# Patient Record
Sex: Male | Born: 1995
Health system: Southern US, Community
[De-identification: ages and names within clinical notes are randomized; demographics above are authoritative.]

## PROBLEM LIST (undated history)

## (undated) DIAGNOSIS — I10 Essential (primary) hypertension: Secondary | ICD-10-CM

## (undated) DIAGNOSIS — I639 Cerebral infarction, unspecified: Secondary | ICD-10-CM

## (undated) DIAGNOSIS — F419 Anxiety disorder, unspecified: Secondary | ICD-10-CM

## (undated) HISTORY — DX: Cerebral infarction, unspecified: I63.9

## (undated) HISTORY — DX: Anxiety disorder, unspecified: F41.9

## (undated) HISTORY — DX: Essential (primary) hypertension: I10

---
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
## 2016-02-29 ENCOUNTER — Other Ambulatory Visit: Payer: Self-pay | Admitting: Family Medicine

## 2016-05-12 ENCOUNTER — Other Ambulatory Visit: Payer: Self-pay | Admitting: Family Medicine

## 2016-06-22 ENCOUNTER — Other Ambulatory Visit: Payer: Self-pay | Admitting: Family Medicine

## 2016-08-01 ENCOUNTER — Other Ambulatory Visit: Payer: Self-pay | Admitting: Family Medicine

## 2016-08-02 ENCOUNTER — Other Ambulatory Visit: Payer: Self-pay | Admitting: Family Medicine

## 2016-08-29 ENCOUNTER — Other Ambulatory Visit: Payer: Self-pay | Admitting: Family Medicine

## 2016-09-18 ENCOUNTER — Other Ambulatory Visit: Payer: Self-pay | Admitting: Family Medicine

## 2016-09-18 NOTE — Telephone Encounter (Signed)
Patient needs refill on his amLODipine-benazepril (LOTREL) 5-10 MG capsule   and also his PARoxetine (PAXIL) 30 MG tablet  Patient has a appt with you on 09/24/16, patient also has only 2 pills left, [facility] in few [facility]

## 2016-09-19 MED ORDER — PAROXETINE HCL 30 MG PO TABS: 30.0000 mg | ORAL_TABLET | Freq: Every day | ORAL | 2 refills | Status: DC

## 2016-09-19 MED ORDER — AMLODIPINE BESY-BENAZEPRIL HCL 5-10 MG PO CAPS: 1.0000 | ORAL_CAPSULE | Freq: Every day | ORAL | 1 refills | Status: DC

## 2016-09-24 ENCOUNTER — Encounter: Payer: Self-pay | Admitting: Family Medicine

## 2016-09-24 ENCOUNTER — Ambulatory Visit (INDEPENDENT_AMBULATORY_CARE_PROVIDER_SITE_OTHER): Payer: BLUE CROSS/BLUE SHIELD | Admitting: Family Medicine

## 2016-09-24 VITALS — BP 126/82 | HR 80 | Temp 98.6°F | Resp 16 | Wt 155.0 lb

## 2016-09-24 DIAGNOSIS — F410 Panic disorder [episodic paroxysmal anxiety] without agoraphobia: Secondary | ICD-10-CM

## 2016-09-24 DIAGNOSIS — I10 Essential (primary) hypertension: Secondary | ICD-10-CM

## 2016-09-24 DIAGNOSIS — F321 Major depressive disorder, single episode, moderate: Secondary | ICD-10-CM

## 2016-09-24 MED ORDER — PAROXETINE HCL 30 MG PO TABS: 60.0000 mg | ORAL_TABLET | Freq: Every day | ORAL | 2 refills | Status: DC

## 2016-09-24 NOTE — Progress Notes (Signed)
Patient: Eddie Evans Male    DOB: [birth year]   21 y.o.   MRN: [id number] Visit Date: 09/24/2016  Today's Provider: Mila Merry, MD   Chief Complaint  Patient presents with  . Hypertension    follow up   . Anxiety   Subjective:    HPI  Hypertension, follow-up:  BP Readings from Last 3 Encounters:  09/24/16 126/82  04/22/15 (!) 140/92  04/18/15 130/90    He was last seen for hypertension 1 years ago.  BP at that visit was 140/92. Management since that visit includes no changes. He reports good compliance with treatment. He is not having side effects.  He is not exercising. He is adherent to low salt diet.   Outside blood pressures are checked daily. Patient denies chest pressure/discomfort, lower extremity edema and palpitations.      Weight trend: stable Wt Readings from Last 3 Encounters:  09/24/16 155 lb (70.3 kg) (53 %, Z= 0.07)*  04/22/15 165 lb (74.8 kg) (75 %, Z= 0.67)*  04/18/15 166 lb (75.3 kg) (76 %, Z= 0.70)*   * Growth percentiles are based on CDC 2-20 Years data.    Current diet: well balanced   Anxiety, follow up: Patient was last seen in the office over 1 year ago (04/22/2015). Patient reports that he feels paroxetine 30mg  may need adjusting. No changes were made on the last visit. Patient reports that he has symptoms of claustrophobia when he is around large groups of people. He reports that he has this sensation very often. He has had more stress lately due to family issues. Has felt depressed.        No Known Allergies   Current Outpatient Prescriptions:  .  amLODipine-benazepril (LOTREL) 5-10 MG capsule, Take 1 capsule by mouth daily., Disp: 20 capsule, Rfl: 1 .  PARoxetine (PAXIL) 30 MG tablet, Take 1 tablet (30 mg total) by mouth daily., Disp: 30 tablet, Rfl: 2  Review of Systems  Constitutional: Negative.   Respiratory: Negative.   Cardiovascular: Negative.   Musculoskeletal: Negative.   Neurological: Negative.     Psychiatric/Behavioral: Positive for dysphoric mood. Negative for agitation, behavioral problems, confusion, hallucinations and self-injury. The patient is nervous/anxious. The patient is not hyperactive.     Social History  Substance Use Topics  . Smoking status: Current Every Day Smoker    Packs/day: 0.75    Types: Cigarettes  . Smokeless tobacco: Not on file  . Alcohol use 0.0 oz/week     Comment: occasional use   Objective:   BP 126/82 (BP Location: Left Arm, Patient Position: Sitting, Cuff Size: Normal)   Pulse 80   Temp 98.6 F (37 C)   Resp 16   Wt 155 lb (70.3 kg)   Physical Exam   General Appearance:    Alert, cooperative, no distress  Eyes:    PERRL, conjunctiva/corneas clear, EOM's intact       Lungs:     Clear to auscultation bilaterally, respirations unlabored  Heart:    Regular rate and rhythm  Neurologic:   Awake, alert, oriented x 3. No apparent focal neurological           defect.           Assessment & Plan:     1. Essential hypertension Well controlled.  Continue current medications.    2. Panic disorder without agoraphobia  - PARoxetine (PAXIL) 30 MG tablet; Take 2 tablets (60 mg total) by mouth daily.  Dispense: 60 tablet; Refill: 2  3. Moderate single current episode of major depressive disorder (HCC) Increase paroxetine to 60mg  daily.  - PARoxetine (PAXIL) 30 MG tablet; Take 2 tablets (60 mg total) by mouth daily.  Dispense: 60 tablet; Refill: 2  Return in about 3 weeks (around 10/15/2016).       Mila Merryonald Ozetta Flatley, MD  [facility]

## 2016-10-15 ENCOUNTER — Ambulatory Visit: Payer: BLUE CROSS/BLUE SHIELD | Admitting: Family Medicine

## 2016-12-04 ENCOUNTER — Encounter: Payer: Self-pay | Admitting: Family Medicine

## 2016-12-04 ENCOUNTER — Other Ambulatory Visit: Payer: Self-pay | Admitting: Family Medicine

## 2016-12-04 ENCOUNTER — Ambulatory Visit (INDEPENDENT_AMBULATORY_CARE_PROVIDER_SITE_OTHER): Payer: BLUE CROSS/BLUE SHIELD | Admitting: Family Medicine

## 2016-12-04 VITALS — BP 142/98 | HR 80 | Temp 98.6°F | Resp 16 | Wt 148.0 lb

## 2016-12-04 DIAGNOSIS — F321 Major depressive disorder, single episode, moderate: Secondary | ICD-10-CM

## 2016-12-04 DIAGNOSIS — S93402A Sprain of unspecified ligament of left ankle, initial encounter: Secondary | ICD-10-CM

## 2016-12-04 DIAGNOSIS — F410 Panic disorder [episodic paroxysmal anxiety] without agoraphobia: Secondary | ICD-10-CM

## 2016-12-04 MED ORDER — PAROXETINE HCL 30 MG PO TABS: 60.0000 mg | ORAL_TABLET | Freq: Every day | ORAL | 4 refills | Status: DC

## 2016-12-04 MED ORDER — AMLODIPINE BESY-BENAZEPRIL HCL 5-10 MG PO CAPS: 1.0000 | ORAL_CAPSULE | Freq: Every day | ORAL | 4 refills | Status: DC

## 2016-12-04 NOTE — Progress Notes (Signed)
Subjective:     Patient ID: Eddie Evans, male   DOB: [birth year], 21 y.o.   MRN: [id number]  HPI  Chief Complaint  Patient presents with  . Ankle Injury    Patient comes in office today with conerns of ankle injury that occured while playing basketball 12/03/16. Patient states that he fell twice while playing basketball and landed on his left side of his ankle both times. Patient denies hearing a pop sound, he can flex foot up and down but complains of pain when bearing weight or walking.   Denies prior ankle injury. Has elevated his ankle and taken tylenol. Accompanied by a friend.   Review of Systems     Objective:   Physical Exam  Constitutional: He appears well-developed and well-nourished. No distress ( using a wheelchair on presentation).  Musculoskeletal:  Moderately tender over the inferior aspect of his left lateral malleolus. No swelling or erythema noted. DF/PF 5/5. Ankle ligaments stable bilaterally.       Assessment:    1. Sprain of left ankle, unspecified ligament, initial encounter: ACE wrap applied.     Plan:    Discussed RICE and use of Tylenol. ACE wrap and crutches.

## 2016-12-04 NOTE — Telephone Encounter (Signed)
Pt contacted office for refill request on the following medications:  [facility] [place] [place]  CB#[phone]  amLODipine-benazepril (LOTREL) 5-10 MG capsule  PARoxetine (PAXIL) 30 MG tablet

## 2016-12-04 NOTE — Patient Instructions (Signed)
Discussed use of rest, ice, elevation, and compression (RICE) with the ACE wrap. Remove the wrap when you go to bed. May use Tylenol extra strength two pills 3 x day. Use crutches to get around until it does not hurt to bear weight on your ankle.

## 2017-05-22 ENCOUNTER — Encounter: Payer: Self-pay | Admitting: Family Medicine

## 2017-05-22 ENCOUNTER — Ambulatory Visit (INDEPENDENT_AMBULATORY_CARE_PROVIDER_SITE_OTHER): Payer: BLUE CROSS/BLUE SHIELD | Admitting: Family Medicine

## 2017-05-22 VITALS — BP 120/80 | HR 69 | Temp 98.3°F | Resp 16 | Wt 147.0 lb

## 2017-05-22 DIAGNOSIS — B356 Tinea cruris: Secondary | ICD-10-CM

## 2017-05-22 MED ORDER — CLOTRIMAZOLE-BETAMETHASONE 1-0.05 % EX CREA: 1.0000 "application " | TOPICAL_CREAM | Freq: Two times a day (BID) | CUTANEOUS | 0 refills | Status: DC

## 2017-05-22 NOTE — Progress Notes (Signed)
       Patient: Eddie AcheMichael B Frater Male    DOB: [birth year]   21 y.o.   MRN: [id number] Visit Date: 05/22/2017  Today's Provider: Mila Merryonald Roshon Duell, MD   Chief Complaint  Patient presents with  . Rash   Subjective:    Rash  This is a new problem. Episode onset: 2 weeks ago. The problem has been gradually worsening since onset. The affected locations include the left upper leg, left lower leg, right upper leg, right lower leg, groin and genitalia. The rash is characterized by pain, redness and itchiness (sores). He was exposed to nothing. Pertinent negatives include no fever, shortness of breath or vomiting. Past treatments include nothing.       No Known Allergies   Current Outpatient Prescriptions:  .  amLODipine-benazepril (LOTREL) 5-10 MG capsule, Take 1 capsule by mouth daily., Disp: 90 capsule, Rfl: 4 .  PARoxetine (PAXIL) 30 MG tablet, Take 2 tablets (60 mg total) by mouth daily., Disp: 180 tablet, Rfl: 4  Review of Systems  Constitutional: Negative for appetite change, chills and fever.  Respiratory: Negative for chest tightness, shortness of breath and wheezing.   Cardiovascular: Negative for chest pain and palpitations.  Gastrointestinal: Negative for abdominal pain, nausea and vomiting.  Skin: Positive for rash.       Itchy skin    Social History  Substance Use Topics  . Smoking status: Current Every Day Smoker    Packs/day: 0.01    Types: Cigarettes  . Smokeless tobacco: Former NeurosurgeonUser    Types: Snuff, Chew     Comment: 1 pack every 2 weeks  . Alcohol use 0.0 oz/week     Comment: occasional use   Objective:   BP 120/80 (BP Location: Right Arm, Patient Position: Sitting, Cuff Size: Normal)   Pulse 69   Temp 98.3 F (36.8 C) (Oral)   Resp 16   Wt 147 lb (66.7 kg)   SpO2 99% Comment: room air There were no vitals filed for this visit.   Physical Exam  General appearance: alert, well developed, well nourished, cooperative and in no distress Head: Normocephalic,  without obvious abnormality, atraumatic Respiratory: Respirations even and unlabored, normal respiratory rate Extremities: No gross deformities Skin: several patches vesicular eruptions and excoriations both lower legs. Moderate erythema in inguinal areas.      Assessment & Plan:     1. Tinea cruris  - clotrimazole-betamethasone (LOTRISONE) cream; Apply 1 application topically 2 (two) times daily.  Dispense: 45 g; Refill: 0  also appears to be areas of contact dermatitis on both LEs which should respond to topical steroid. Call if not.       Mila Merryonald Cambrea Kirt, MD  [facility]

## 2018-01-06 ENCOUNTER — Other Ambulatory Visit: Payer: Self-pay | Admitting: Family Medicine

## 2018-12-04 ENCOUNTER — Other Ambulatory Visit: Payer: Self-pay | Admitting: Family Medicine

## 2018-12-04 NOTE — Telephone Encounter (Signed)
Patient has not been seen since 2018, please advise have sent 30 day prescription but he needs office visit within next 30 days.

## 2018-12-04 NOTE — Telephone Encounter (Signed)
Tried calling; pt VM has not been set up.   Thanks,   -Vernona Rieger

## 2018-12-05 ENCOUNTER — Ambulatory Visit: Payer: BLUE CROSS/BLUE SHIELD | Admitting: Family Medicine

## 2018-12-05 ENCOUNTER — Other Ambulatory Visit: Payer: Self-pay

## 2018-12-05 ENCOUNTER — Encounter: Payer: Self-pay | Admitting: Family Medicine

## 2018-12-05 VITALS — BP 118/64 | HR 76 | Temp 98.4°F | Ht 65.0 in | Wt 161.2 lb

## 2018-12-05 DIAGNOSIS — Z23 Encounter for immunization: Secondary | ICD-10-CM | POA: Diagnosis not present

## 2018-12-05 DIAGNOSIS — I10 Essential (primary) hypertension: Secondary | ICD-10-CM

## 2018-12-05 NOTE — Progress Notes (Signed)
lu      Patient: Eddie Evans Male    DOB: 04-Oct-1995   22 y.o.   MRN: [id number] Visit Date: 12/05/2018  Today's Provider: Mila Merry, MD   Chief Complaint  Patient presents with  . Hypertension    fup   Subjective:     HPI   Hypertension, follow-up:  BP Readings from Last 3 Encounters:  12/05/18 118/64  05/22/17 120/80  12/04/16 (!) 142/98    He was last seen for hypertension 2 years ago.  BP at that visit was 126/82 Management since that visit includes no changes. He reports good compliance with treatment. He is not having side effects.  He is exercising yard work. He is not adherent to low salt diet.   Outside blood pressures haven't been checked much outside of office. He is experiencing none.  Patient denies none.   Cardiovascular risk factors include hypertension  Use of agents associated with hypertension: none.     Weight trend: stable Wt Readings from Last 3 Encounters:  12/05/18 161 lb 3.2 oz (73.1 kg)  05/22/17 147 lb (66.7 kg) (36 %, Z= -0.35)*  12/04/16 148 lb (67.1 kg) (40 %, Z= -0.25)*   * Growth percentiles are based on CDC (Boys, 2-20 Years) data.    Current diet: well balanced  ------------------------------------------------------------------------   No Known Allergies   Current Outpatient Medications:  .  amLODipine-benazepril (LOTREL) 5-10 MG capsule, TAKE 1 CAPSULE BY MOUTH EVERY DAY, Disp: 30 capsule, Rfl: 0 .  clotrimazole-betamethasone (LOTRISONE) cream, Apply 1 application topically 2 (two) times daily. (Patient not taking: Reported on 12/05/2018), Disp: 45 g, Rfl: 0  Review of Systems  Constitutional: Negative.   HENT: Negative.   Eyes: Negative.   Respiratory: Negative.   Cardiovascular: Negative.   Gastrointestinal: Negative.   Endocrine: Negative.   Genitourinary: Negative.   Musculoskeletal: Negative.   Skin: Negative.   Allergic/Immunologic: Negative.   Neurological: Negative.   Hematological: Negative.    Psychiatric/Behavioral: Negative.     Social History   Tobacco Use  . Smoking status: Current Every Day Smoker    Packs/day: 0.01    Types: Cigarettes  . Smokeless tobacco: Former Neurosurgeon    Types: Snuff, Chew  . Tobacco comment: 1 pack every 2 weeks  Substance Use Topics  . Alcohol use: Yes    Alcohol/week: 0.0 standard drinks    Comment: occasional use      Objective:   BP 118/64 (BP Location: Left Arm, Patient Position: Sitting, Cuff Size: Normal)   Pulse 76   Temp 98.4 F (36.9 C) (Oral)   Ht 5\' 5"  (1.651 m)   Wt 161 lb 3.2 oz (73.1 kg)   SpO2 98%   BMI 26.83 kg/m  Vitals:   12/05/18 1511  BP: 118/64  Pulse: 76  Temp: 98.4 F (36.9 C)  TempSrc: Oral  SpO2: 98%  Weight: 161 lb 3.2 oz (73.1 kg)  Height: 5\' 5"  (1.651 m)     Physical Exam   General Appearance:    Alert, cooperative, no distress  Eyes:    PERRL, conjunctiva/corneas clear, EOM's intact       Lungs:     Clear to auscultation bilaterally, respirations unlabored  Heart:    Regular rate and rhythm  Neurologic:   Awake, alert, oriented x 3. No apparent focal neurological           defect.           Assessment & Plan  1. Essential hypertension Well controlled.  Continue current medications.   - Renal function panel  2. Need for influenza vaccination  - Flu Vaccine QUAD 6+ mos PF IM (Fluarix Quad PF)     Mila Merry, MD  [facility]

## 2018-12-05 NOTE — Patient Instructions (Signed)
.   Please review the attached list of medications and notify my office if there are any errors.   . Please bring all of your medications to every appointment so we can make sure that our medication list is the same as yours.   

## 2018-12-05 NOTE — Telephone Encounter (Signed)
Apt made for 3:20 today.   Thanks,   -Vernona Rieger

## 2018-12-06 LAB — RENAL FUNCTION PANEL
ALBUMIN: 5 g/dL (ref 4.1–5.2)
BUN/Creatinine Ratio: 10 (ref 9–20)
BUN: 9 mg/dL (ref 6–20)
CO2: 22 mmol/L (ref 20–29)
Calcium: 10.2 mg/dL (ref 8.7–10.2)
Chloride: 101 mmol/L (ref 96–106)
Creatinine, Ser: 0.91 mg/dL (ref 0.76–1.27)
GFR calc Af Amer: 139 mL/min/{1.73_m2} (ref >59)
GFR, EST NON AFRICAN AMERICAN: 120 mL/min/{1.73_m2} (ref >59)
Glucose: 91 mg/dL (ref 65–99)
POTASSIUM: 4 mmol/L (ref 3.5–5.2)
Phosphorus: 3.3 mg/dL (ref 2.8–4.1)
Sodium: 139 mmol/L (ref 134–144)

## 2018-12-08 ENCOUNTER — Telehealth: Payer: Self-pay

## 2018-12-08 NOTE — Telephone Encounter (Signed)
-----   Message from Malva Limes, MD sent at 12/06/2018  1:00 PM EDT ----- Labs normal. Continue current medications.  Follow up 12 months

## 2018-12-08 NOTE — Telephone Encounter (Signed)
Unable to reach patient at this time, voicemail box is not set up yet. KW 

## 2018-12-09 NOTE — Telephone Encounter (Signed)
Tried calling voice mail is not set up.    Thanks,   -Vernona Rieger

## 2018-12-15 NOTE — Telephone Encounter (Signed)
Tried to call pt again and no VM to leave message

## 2018-12-19 NOTE — Telephone Encounter (Signed)
Patient has been advised. KW 

## 2019-01-26 ENCOUNTER — Other Ambulatory Visit: Payer: Self-pay | Admitting: Family Medicine

## 2019-09-01 ENCOUNTER — Other Ambulatory Visit: Payer: Self-pay | Admitting: Family Medicine

## 2019-09-01 NOTE — Telephone Encounter (Signed)
Requested medication (s) are due for refill today: yes  Requested medication (s) are on the active medication list: yes  Last refill:  08/11/2019  Future visit scheduled: no  Notes to clinic:  LOV-12/05/2018 Review for refill   Requested Prescriptions  Pending Prescriptions Disp Refills   amLODipine-benazepril (LOTREL) 5-10 MG capsule [Pharmacy Med Name: AMLODIPINE-BENAZEPRIL 5-10 MG] 30 capsule 6    Sig: TAKE 1 CAPSULE BY MOUTH EVERY DAY      Cardiovascular: CCB + ACEI Combos Failed - 09/01/2019  1:52 AM      Failed - Cr in normal range and within 180 days    Creatinine, Ser  Date Value Ref Range Status  12/05/2018 0.91 0.76 - 1.27 mg/dL Final          Failed - K in normal range and within 180 days    Potassium  Date Value Ref Range Status  12/05/2018 4.0 3.5 - 5.2 mmol/L Final          Failed - Valid encounter within last 6 months    Recent Outpatient Visits           9 months ago Essential hypertension   [facility] Birdie Sons, MD   2 years ago Tinea cruris   [facility] Birdie Sons, MD   2 years ago Sprain of left ankle, unspecified ligament, initial encounter   [place] [place]   2 years ago Essential hypertension   [facility] Birdie Sons, MD   4 years ago Annual physical exam   [facility] Birdie Sons, MD              Passed - Patient is not pregnant      Passed - Last BP in normal range    BP Readings from Last 1 Encounters:  12/05/18 118/64

## 2019-10-30 ENCOUNTER — Ambulatory Visit: Payer: BLUE CROSS/BLUE SHIELD | Admitting: Family Medicine

## 2019-11-02 ENCOUNTER — Ambulatory Visit: Payer: Self-pay | Attending: Internal Medicine

## 2019-11-03 ENCOUNTER — Ambulatory Visit: Payer: Self-pay

## 2019-11-04 ENCOUNTER — Ambulatory Visit (INDEPENDENT_AMBULATORY_CARE_PROVIDER_SITE_OTHER): Payer: BC Managed Care – PPO | Admitting: Family Medicine

## 2019-11-04 ENCOUNTER — Encounter: Payer: Self-pay | Admitting: Family Medicine

## 2019-11-04 ENCOUNTER — Other Ambulatory Visit: Payer: Self-pay

## 2019-11-04 VITALS — BP 161/106 | HR 82 | Temp 96.9°F | Wt 161.0 lb

## 2019-11-04 DIAGNOSIS — I10 Essential (primary) hypertension: Secondary | ICD-10-CM

## 2019-11-04 NOTE — Progress Notes (Signed)
Patient: Eddie Evans Male    DOB: 01-26-1996   24 y.o.   MRN: [id number] Visit Date: 11/04/2019  Today's Provider: Lavon Paganini, MD   No chief complaint on file.  Subjective:     HPI    Hypertension, follow-up:  BP Readings from Last 3 Encounters:  11/04/19 (!) 161/106  12/05/18 118/64  05/22/17 120/80    He was last seen for hypertension 1 years ago.  BP at that visit was well controlled. Management since that visit includes No changes. He reports excellent compliance with treatment.  He admits he has not taken his medication yet this morning, however. He is not having side effects.  He is exercising. He is adherent to low salt diet.   Outside blood pressures are not being check. He is experiencing none.  Patient denies chest pain, fatigue and lower extremity edema.   Cardiovascular risk factors include hypertension and male gender.  Use of agents associated with hypertension: none.     Weight trend: stable Wt Readings from Last 3 Encounters:  11/04/19 161 lb (73 kg)  12/05/18 161 lb 3.2 oz (73.1 kg)  05/22/17 147 lb (66.7 kg) (36 %, Z= -0.35)*   * Growth percentiles are based on CDC (Boys, 2-20 Years) data.    ------------------------------------------------------------------------ He was screened with Covid questionnaire at arrival for his visit and said no to all questions.  After I was in the room with him for at least 10 minutes, mask the entire time, he asks for a letter to be released back to work.  When questioned about this, he states that he and his son had fevers 4 to 5 days ago along with GI symptoms, which have now resolved.  His work told him he needed to get a Covid test, which he did not.  I advised him that he was not supposed to be in the clinic if he has had any of the symptoms within the last 14 days.  I advised him that I would not give him a work note and that he did need to go get a Covid test.  He was escorted out the back exit, so  as not to expose any other staff or patients.   No Known Allergies   Current Outpatient Medications:  .  amLODipine-benazepril (LOTREL) 5-10 MG capsule, Take 1 capsule by mouth daily. Patient needs appointment for further refills, Disp: 30 capsule, Rfl: 1 .  clotrimazole-betamethasone (LOTRISONE) cream, Apply 1 application topically 2 (two) times daily. (Patient not taking: Reported on 12/05/2018), Disp: 45 g, Rfl: 0  Review of Systems  Constitutional: Negative.   Respiratory: Negative.   Cardiovascular: Negative.   Gastrointestinal: Positive for abdominal pain and diarrhea. Negative for abdominal distention, anal bleeding, blood in stool, constipation, nausea, rectal pain and vomiting.  Neurological: Negative for dizziness, light-headedness and headaches.    Social History   Tobacco Use  . Smoking status: Current Every Day Smoker    Packs/day: 0.01    Types: Cigarettes  . Smokeless tobacco: Former Systems developer    Types: Snuff, Chew  . Tobacco comment: 1 pack every 2 weeks  Substance Use Topics  . Alcohol use: Yes    Alcohol/week: 0.0 standard drinks    Comment: occasional use      Objective:   BP (!) 161/106 (BP Location: Right Arm, Patient Position: Sitting, Cuff Size: Normal)   Pulse 82   Temp (!) 96.9 F (36.1 C) (Temporal)   Wt 161 lb (73 kg)  BMI 26.79 kg/m  Vitals:   11/04/19 1629  BP: (!) 161/106  Pulse: 82  Temp: (!) 96.9 F (36.1 C)  TempSrc: Temporal  Weight: 161 lb (73 kg)  Body mass index is 26.79 kg/m.   Physical Exam Vitals reviewed.  Constitutional:      General: He is not in acute distress.    Appearance: Normal appearance. He is not diaphoretic.  HENT:     Head: Normocephalic and atraumatic.  Eyes:     General: No scleral icterus.    Conjunctiva/sclera: Conjunctivae normal.  Cardiovascular:     Rate and Rhythm: Normal rate and regular rhythm.     Pulses: Normal pulses.     Heart sounds: Normal heart sounds. No murmur.  Pulmonary:      Effort: Pulmonary effort is normal. No respiratory distress.     Breath sounds: Normal breath sounds. No wheezing or rhonchi.  Abdominal:     General: There is no distension.     Palpations: Abdomen is soft.     Tenderness: There is no abdominal tenderness.  Musculoskeletal:     Cervical back: Neck supple.     Right lower leg: No edema.     Left lower leg: No edema.  Lymphadenopathy:     Cervical: No cervical adenopathy.  Skin:    General: Skin is warm and dry.     Capillary Refill: Capillary refill takes less than 2 seconds.     Findings: No rash.  Neurological:     Mental Status: He is alert and oriented to person, place, and time.     Cranial Nerves: No cranial nerve deficit.  Psychiatric:        Mood and Affect: Mood normal.        Behavior: Behavior normal.      No results found for any visits on 11/04/19.     Assessment & Plan    Problem List Items Addressed This Visit      Cardiovascular and Mediastinum   Hypertension - Primary    Uncontrolled States he has not taken his medication yet this morning Advised him on the importance of taking the medication regularly every day He can continue current medication as he was previously well controlled with this dose Advised on home blood pressure monitoring Recheck metabolic panel Follow-up with PCP in 1 month and consider dose titration of medication      Relevant Orders   Comprehensive metabolic panel   Lipid panel       Return in about 4 weeks (around 12/02/2019) for BP f/u with PCP.   The entirety of the information documented in the History of Present Illness, Review of Systems and Physical Exam were personally obtained by me. Portions of this information were initially documented by Kavin Leech, CMA and reviewed by me for thoroughness and accuracy.    Dollie Mayse, Marzella Schlein, MD MPH [facility]

## 2019-11-04 NOTE — Patient Instructions (Signed)

## 2019-11-05 NOTE — Assessment & Plan Note (Signed)
Uncontrolled States he has not taken his medication yet this morning Advised him on the importance of taking the medication regularly every day He can continue current medication as he was previously well controlled with this dose Advised on home blood pressure monitoring Recheck metabolic panel Follow-up with PCP in 1 month and consider dose titration of medication

## 2019-11-26 ENCOUNTER — Other Ambulatory Visit: Payer: Self-pay | Admitting: Family Medicine

## 2019-12-07 ENCOUNTER — Telehealth: Payer: Self-pay

## 2019-12-07 ENCOUNTER — Other Ambulatory Visit: Payer: Self-pay | Admitting: Family Medicine

## 2019-12-07 NOTE — Telephone Encounter (Signed)
Attempted to contact patient to advise him that labs are not needed at this time. He has a follow up scheduled for 01/12/2020. Medication was sent to pharmacy on 11/26/2019 with 2 refills. No answer and voicemail has not been setup.Okay for [facility] to advise patient.

## 2019-12-07 NOTE — Telephone Encounter (Signed)
Attempted to call patient, no answer. Unable to leave message. Voicemail has not been setup.

## 2019-12-07 NOTE — Telephone Encounter (Signed)
Medication Refill - Medication: amLODipine-benazepril (LOTREL) 5-10 MG capsule   Coming in tomorrow for lab work  Has the patient contacted their pharmacy? Yes.   (Agent: If no, request that the patient contact the pharmacy for the refill.) (Agent: If yes, when and what did the pharmacy advise?)  Preferred Pharmacy (with phone number or street name):[facility] [address] [place], [place] - [address]  [address] [place], [place] [address]  Phone:  [phone] Fax:  [phone]     Agent: Please be advised that RX refills may take up to 3 business days. We ask that you follow-up with your pharmacy.

## 2019-12-07 NOTE — Telephone Encounter (Signed)
Looks like there are labs already ordered by Dr B on his last visit.  Also on that visit she said he needed to f/u with PCP in one month for the BP to be rechecked   Copied from CRM #[id number]. Topic: Appointment Scheduling - Scheduling Inquiry for Clinic >> Dec 07, 2019  3:14 PM Eddie Evans wrote: Reason for CRM: Pt needs lab orders for his BP medication refill

## 2019-12-09 NOTE — Telephone Encounter (Signed)
After reviewing his chart, I found that patient never had labs completed that were ordered by Dr. B during last office visit on 11/04/2019. Patient needs to complete labs that were ordered. Tried calling patient. Unable to reach patient or leave a message since his voice mailbox is not set up.

## 2019-12-16 NOTE — Telephone Encounter (Signed)
Tried calling patient. Unable to reach patient or leave a message since his voice mailbox is not set up.

## 2020-01-12 ENCOUNTER — Encounter: Payer: BLUE CROSS/BLUE SHIELD | Admitting: Family Medicine

## 2020-01-12 NOTE — Progress Notes (Deleted)
Complete physical exam   Patient: Eddie Evans   DOB: September 06, 1996   24 y.o. Male  MRN: [id number] Visit Date: 01/12/2020  Today's healthcare provider: Lelon Huh, MD   No chief complaint on file.  Subjective    Eddie Evans is a 24 y.o. male who presents today for a complete physical exam.  He reports consuming a {diet types:17450} diet. {Exercise:19826} He generally feels {well/fairly well/poorly:18703}. He reports sleeping {well/fairly well/poorly:18703}. He {does/does not:200015} have additional problems to discuss today.  HPI  ***  Past Medical History:  Diagnosis Date  . Anxiety   . Hypertension    No past surgical history on file. Social History   Socioeconomic History  . Marital status: Single    Spouse name: Not on file  . Number of children: Not on file  . Years of education: Not on file  . Highest education level: Not on file  Occupational History  . Occupation: High school student    Comment: [employer]  Tobacco Use  . Smoking status: Current Every Day Smoker    Packs/day: 0.01    Types: Cigarettes  . Smokeless tobacco: Former Systems developer    Types: Snuff, Chew  . Tobacco comment: 1 pack every 2 weeks  Substance and Sexual Activity  . Alcohol use: Yes    Alcohol/week: 0.0 standard drinks    Comment: occasional use  . Drug use: Yes    Types: Marijuana  . Sexual activity: Not on file  Other Topics Concern  . Not on file  Social History Narrative  . Not on file   Social Determinants of Health   Financial Resource Strain:   . Difficulty of Paying Living Expenses:   Food Insecurity:   . Worried About Charity fundraiser in the Last Year:   . Arboriculturist in the Last Year:   Transportation Needs:   . Film/video editor (Medical):   Marland Kitchen Lack of Transportation (Non-Medical):   Physical Activity:   . Days of Exercise per Week:   . Minutes of Exercise per Session:   Stress:   . Feeling of Stress :   Social Connections:   .  Frequency of Communication with Friends and Family:   . Frequency of Social Gatherings with Friends and Family:   . Attends Religious Services:   . Active Member of Clubs or Organizations:   . Attends Archivist Meetings:   Marland Kitchen Marital Status:   Intimate Partner Violence:   . Fear of Current or Ex-Partner:   . Emotionally Abused:   Marland Kitchen Physically Abused:   . Sexually Abused:    Family Status  Relation Name Status  . Mother  Alive  . Father  Alive  . Sister  Alive  . Brother  Alive   Family History  Problem Relation Age of Onset  . Diabetes Mother   . Hyperlipidemia Mother   . Hypertension Mother    No Known Allergies  Patient Care Team: Birdie Sons, MD as PCP - General (Family Medicine)   Medications: Outpatient Medications Prior to Visit  Medication Sig  . amLODipine-benazepril (LOTREL) 5-10 MG capsule TAKE 1 CAPSULE BY MOUTH DAILY. PATIENT NEEDS APPOINTMENT FOR FURTHER REFILLS   No facility-administered medications prior to visit.    Review of Systems  {Show previous labs (optional):23779::" "}  Objective    There were no vitals taken for this visit. {Show previous vital signs (optional):23777::" "}  Physical Exam  ***  Depression Screen  PHQ 2/9 Scores 12/05/2018  PHQ - 2 Score 0    No results found for any visits on 01/12/20.  Assessment & Plan    Routine Health Maintenance and Physical Exam  Exercise Activities and Dietary recommendations Goals   None     Immunization History  Administered Date(s) Administered  . DTaP 09/06/1997, 11/08/1997, 01/20/1998, 10/26/1998, 03/08/2003  . HPV 9-valent 04/22/2015  . Hepatitis A 06/13/2009  . Hepatitis B 06-09-1997, 08/16/1997, 01/20/1998  . HiB (PRP-OMP) 09/06/1997, 11/08/1997, 10/26/1998  . IPV 09/06/1997, 11/08/1997, 07/11/1998, 03/08/2003  . Influenza,inj,Quad PF,6+ Mos 12/05/2018  . MMR 07/11/1998, 03/08/2003  . Meningococcal B, OMV 04/22/2015  . Meningococcal Conjugate 06/13/2009,  04/22/2015  . Pneumococcal-Unspecified 09/06/1997, 11/08/1997, 10/26/2000  . Td 04/21/2009  . Tdap 04/21/2009  . Varicella 07/11/1998, 04/21/2009    Health Maintenance  Topic Date Due  . HIV Screening  Never done  . COVID-19 Vaccine (1) Never done  . TETANUS/TDAP  04/22/2019  . INFLUENZA VACCINE  04/17/2020    Discussed health benefits of physical activity, and encouraged him to engage in regular exercise appropriate for his age and condition.  ***  No follow-ups on file.     {provider attestation***:1}   Lelon Huh, MD  [facility] [phone] (phone) [phone] (fax)  [place]

## 2020-01-25 ENCOUNTER — Ambulatory Visit: Payer: BC Managed Care – PPO | Attending: Internal Medicine

## 2020-01-25 DIAGNOSIS — Z20822 Contact with and (suspected) exposure to covid-19: Secondary | ICD-10-CM | POA: Insufficient documentation

## 2020-01-26 LAB — NOVEL CORONAVIRUS, NAA: SARS-CoV-2, NAA: NOT DETECTED

## 2020-01-26 LAB — SARS-COV-2, NAA 2 DAY TAT

## 2020-03-04 ENCOUNTER — Other Ambulatory Visit: Payer: Self-pay | Admitting: Family Medicine

## 2020-03-04 NOTE — Telephone Encounter (Signed)
Requested medication (s) are due for refill today: yes  Requested medication (s) are on the active medication list: yes  Last refill:  02/04/2020  Future visit scheduled: no  Notes to clinic:  Left a vm for patient to contact office to schedule Courtesy refill has already been given  Please advise    Requested Prescriptions  Pending Prescriptions Disp Refills   amLODipine-benazepril (LOTREL) 5-10 MG capsule [Pharmacy Med Name: AMLODIPINE-BENAZEPRIL 5-10 MG] 30 capsule 2    Sig: TAKE 1 CAPSULE BY MOUTH DAILY. PATIENT NEEDS APPOINTMENT FOR FURTHER REFILLS      Cardiovascular: CCB + ACEI Combos Failed - 03/04/2020 11:03 AM      Failed - Cr in normal range and within 180 days    Creatinine, Ser  Date Value Ref Range Status  12/05/2018 0.91 0.76 - 1.27 mg/dL Final          Failed - K in normal range and within 180 days    Potassium  Date Value Ref Range Status  12/05/2018 4.0 3.5 - 5.2 mmol/L Final          Failed - Last BP in normal range    BP Readings from Last 1 Encounters:  11/04/19 (!) 161/106          Passed - Patient is not pregnant      Passed - Valid encounter within last 6 months    Recent Outpatient Visits           4 months ago Essential hypertension   [facility] [place], Marzella Schlein, MD   1 year ago Essential hypertension   [facility] Malva Limes, MD   2 years ago Tinea cruris   [facility] Malva Limes, MD   3 years ago Sprain of left ankle, unspecified ligament, initial encounter   [facility] [place], [place]   3 years ago Essential hypertension   [facility] Sherrie Mustache, Demetrios Isaacs, MD

## 2020-03-08 ENCOUNTER — Ambulatory Visit: Payer: Self-pay | Admitting: Family Medicine

## 2020-03-20 ENCOUNTER — Other Ambulatory Visit: Payer: Self-pay | Admitting: Family Medicine

## 2020-03-20 NOTE — Telephone Encounter (Signed)
Requested Prescriptions  Pending Prescriptions Disp Refills  . amLODipine-benazepril (LOTREL) 5-10 MG capsule [Pharmacy Med Name: AMLODIPINE-BENAZEPRIL 5-10 MG] 90 capsule 0    Sig: TAKE 1 CAPSULE BY MOUTH DAILY. PATIENT NEEDS APPOINTMENT FOR FURTHER REFILLS     Cardiovascular: CCB + ACEI Combos Failed - 03/20/2020 12:23 PM      Failed - Cr in normal range and within 180 days    Creatinine, Ser  Date Value Ref Range Status  12/05/2018 0.91 0.76 - 1.27 mg/dL Final         Failed - K in normal range and within 180 days    Potassium  Date Value Ref Range Status  12/05/2018 4.0 3.5 - 5.2 mmol/L Final         Failed - Last BP in normal range    BP Readings from Last 1 Encounters:  11/04/19 (!) 161/106         Passed - Patient is not pregnant      Passed - Valid encounter within last 6 months    Recent Outpatient Visits          4 months ago Essential hypertension   [facility] [place], Marzella Schlein, MD   1 year ago Essential hypertension   [facility] Malva Limes, MD   2 years ago Tinea cruris   [facility] Malva Limes, MD   3 years ago Sprain of left ankle, unspecified ligament, initial encounter   [facility] [place], [place], [place]   3 years ago Essential hypertension   [facility] [place], Demetrios Isaacs, MD      Future Appointments            In 2 weeks Bacigalupo, Marzella Schlein, MD [facility], [facility]

## 2020-04-04 ENCOUNTER — Ambulatory Visit: Payer: BC Managed Care – PPO | Admitting: Family Medicine

## 2020-06-12 ENCOUNTER — Other Ambulatory Visit: Payer: Self-pay | Admitting: Family Medicine

## 2020-06-12 NOTE — Telephone Encounter (Signed)
Requested medication (s) are due for refill today: yes  Requested medication (s) are on the active medication list: yes  Last refill:  03/20/20  Future visit scheduled: no  Notes to clinic:  called pt but unable to LM - VM not set up- needs appt   Requested Prescriptions  Pending Prescriptions Disp Refills   amLODipine-benazepril (LOTREL) 5-10 MG capsule [Pharmacy Med Name: AMLODIPINE-BENAZEPRIL 5-10 MG] 90 capsule 0    Sig: TAKE 1 CAPSULE BY MOUTH DAILY. PATIENT NEEDS APPOINTMENT FOR FURTHER REFILLS      Cardiovascular: CCB + ACEI Combos Failed - 06/12/2020  1:12 PM      Failed - Cr in normal range and within 180 days    Creatinine, Ser  Date Value Ref Range Status  12/05/2018 0.91 0.76 - 1.27 mg/dL Final          Failed - K in normal range and within 180 days    Potassium  Date Value Ref Range Status  12/05/2018 4.0 3.5 - 5.2 mmol/L Final          Failed - Last BP in normal range    BP Readings from Last 1 Encounters:  11/04/19 (!) 161/106          Failed - Valid encounter within last 6 months    Recent Outpatient Visits           7 months ago Essential hypertension   [facility] [place], Marzella Schlein, MD   1 year ago Essential hypertension   [facility] Malva Limes, MD   3 years ago Tinea cruris   [facility] Malva Limes, MD   3 years ago Sprain of left ankle, unspecified ligament, initial encounter   [facility] [place], [place]   3 years ago Essential hypertension   [facility] Malva Limes, MD              Passed - Patient is not pregnant

## 2020-07-13 ENCOUNTER — Ambulatory Visit: Payer: BC Managed Care – PPO | Admitting: Family Medicine

## 2020-08-16 ENCOUNTER — Encounter: Payer: Self-pay | Admitting: Family Medicine

## 2020-08-16 ENCOUNTER — Ambulatory Visit: Payer: BC Managed Care – PPO | Admitting: Family Medicine

## 2020-08-16 ENCOUNTER — Other Ambulatory Visit: Payer: Self-pay

## 2020-08-16 VITALS — BP 156/109 | HR 78 | Temp 98.1°F | Resp 16

## 2020-08-16 DIAGNOSIS — I10 Essential (primary) hypertension: Secondary | ICD-10-CM

## 2020-08-16 MED ORDER — AMLODIPINE BESY-BENAZEPRIL HCL 5-10 MG PO CAPS: 1.0000 | ORAL_CAPSULE | Freq: Every day | ORAL | 0 refills | Status: DC

## 2020-08-16 NOTE — Patient Instructions (Signed)
.   Please review the attached list of medications and notify my office if there are any errors.   . I recommend that you get a flu vaccine this year. Please call our office at [phone] at your earliest convenience to schedule a flu shot.   . You are due for a Td (tetanus-diptheria-vaccine) which protects you from tetanus and diptheria. Please check with your insurance plan or pharmacy regarding coverage for this vaccine.   . You are due for a HPV  (tetanus-diptheria-vaccine) which protects you from tetanus and diptheria. Please check with your insurance plan or pharmacy regarding coverage for this vaccine.

## 2020-08-16 NOTE — Progress Notes (Signed)
Established patient visit   Patient: Eddie Evans   DOB: 01/14/1996   24 y.o. Male  MRN: [id number] Visit Date: 08/16/2020  Today's healthcare provider: Mila Merry, MD   Chief Complaint  Patient presents with  . Hypertension   Subjective    HPI  Hypertension, follow-up  BP Readings from Last 3 Encounters:  08/16/20 (!) 156/109  11/04/19 (!) 161/106  12/05/18 118/64   Wt Readings from Last 3 Encounters:  11/04/19 161 lb (73 kg)  12/05/18 161 lb 3.2 oz (73.1 kg)  05/22/17 147 lb (66.7 kg) (36 %, Z= -0.35)*   * Growth percentiles are based on CDC (Boys, 2-20 Years) data.     He was last seen for hypertension 9 months ago (seen by Dr. Beryle Evans)     BP at that visit was 161/106. During that visit it was noted that he had not taken his medication as prescribed. Patient was counseled on the importance of taking the medication regularly every day. He was   To continue current medication as he was previously well controlled with this dose. Advised on home blood pressure monitoring  He reports poor compliance with treatment. Has been out of medication for the past 2 months. He is not having side effects.  He is following a Regular diet. He is not exercising. He does smoke.  Use of agents associated with hypertension: none.   Outside blood pressures are not checked. Symptoms: Yes chest pain No chest pressure  No palpitations No syncope  No dyspnea No orthopnea  No paroxysmal nocturnal dyspnea No lower extremity edema   Pertinent labs: Lab Results  Component Value Date   CHOL 152 04/22/2015   HDL 45 04/22/2015   LDLCALC 80 04/22/2015   TRIG 134 (H) 04/22/2015   CHOLHDL 3.4 04/22/2015   Lab Results  Component Value Date   NA 139 12/05/2018   K 4.0 12/05/2018   CREATININE 0.91 12/05/2018   GFRNONAA 120 12/05/2018   GFRAA 139 12/05/2018   GLUCOSE 91 12/05/2018     The ASCVD Risk score (Goff DC Jr., et al., 2013) failed to calculate for the following  reasons:   The 2013 ASCVD risk score is only valid for ages 59 to 23   ---------------------------------------------------------------------------------------------------    Medications: Outpatient Medications Prior to Visit  Medication Sig  . amLODipine-benazepril (LOTREL) 5-10 MG capsule TAKE 1 CAPSULE BY MOUTH DAILY. PATIENT NEEDS APPOINTMENT FOR FURTHER REFILLS   No facility-administered medications prior to visit.    Review of Systems  Constitutional: Negative for appetite change, chills and fever.  Respiratory: Negative for chest tightness, shortness of breath and wheezing.   Cardiovascular: Positive for chest pain. Negative for palpitations.  Gastrointestinal: Negative for abdominal pain, nausea and vomiting.     Objective    BP (!) 156/109 (BP Location: Right Arm, Patient Position: Sitting, Cuff Size: Normal)   Pulse 78   Temp 98.1 F (36.7 C) (Oral)   Resp 16   Physical Exam    General: Appearance:     Overweight male in no acute distress  Eyes:    PERRL, conjunctiva/corneas clear, EOM's intact       Lungs:     Clear to auscultation bilaterally, respirations unlabored  Heart:    Normal heart rate. Normal rhythm. No murmurs, rubs, or gallops.   MS:   All extremities are intact.   Neurologic:   Awake, alert, oriented x 3. No apparent focal neurological  defect.         No results found for any visits on 08/16/20.  Assessment & Plan     1. Primary hypertension Uncontrolled, has been out of medications of a few months.   - Lipid panel - Renal function panel - amLODipine-benazepril (LOTREL) 5-10 MG capsule; Take 1 capsule by mouth daily.  Dispense: 90 capsule; Refill: 0   Return for blood pressure check in 3-4 weeks.      The entirety of the information documented in the History of Present Illness, Review of Systems and Physical Exam were personally obtained by me. Portions of this information were initially documented by the CMA and reviewed by  me for thoroughness and accuracy.      Eddie Merry, MD  [facility] [phone] (phone) [phone] (fax)  [facility]

## 2020-08-17 LAB — LIPID PANEL
Chol/HDL Ratio: 2.9 ratio (ref 0.0–5.0)
Cholesterol, Total: 163 mg/dL (ref 100–199)
HDL: 57 mg/dL (ref >39)
LDL Chol Calc (NIH): 75 mg/dL (ref 0–99)
Triglycerides: 184 mg/dL — ABNORMAL HIGH (ref 0–149)
VLDL Cholesterol Cal: 31 mg/dL (ref 5–40)

## 2020-08-17 LAB — RENAL FUNCTION PANEL
Albumin: 4.7 g/dL (ref 4.1–5.2)
BUN/Creatinine Ratio: 19 (ref 9–20)
BUN: 14 mg/dL (ref 6–20)
CO2: 22 mmol/L (ref 20–29)
Calcium: 9.7 mg/dL (ref 8.7–10.2)
Chloride: 101 mmol/L (ref 96–106)
Creatinine, Ser: 0.75 mg/dL — ABNORMAL LOW (ref 0.76–1.27)
GFR calc Af Amer: 149 mL/min/{1.73_m2} (ref >59)
GFR calc non Af Amer: 129 mL/min/{1.73_m2} (ref >59)
Glucose: 92 mg/dL (ref 65–99)
Phosphorus: 3.6 mg/dL (ref 2.8–4.1)
Potassium: 3.8 mmol/L (ref 3.5–5.2)
Sodium: 138 mmol/L (ref 134–144)

## 2020-09-04 DIAGNOSIS — K5900 Constipation, unspecified: Secondary | ICD-10-CM | POA: Diagnosis not present

## 2020-09-04 DIAGNOSIS — R1010 Upper abdominal pain, unspecified: Secondary | ICD-10-CM | POA: Diagnosis not present

## 2020-10-03 ENCOUNTER — Ambulatory Visit: Payer: BC Managed Care – PPO | Admitting: Family Medicine

## 2020-10-14 ENCOUNTER — Encounter: Payer: Self-pay | Admitting: Family Medicine

## 2020-10-14 ENCOUNTER — Other Ambulatory Visit: Payer: Self-pay

## 2020-10-14 ENCOUNTER — Ambulatory Visit (INDEPENDENT_AMBULATORY_CARE_PROVIDER_SITE_OTHER): Payer: BLUE CROSS/BLUE SHIELD | Admitting: Family Medicine

## 2020-10-14 VITALS — BP 133/67 | HR 87 | Temp 98.6°F | Resp 16 | Wt 164.0 lb

## 2020-10-14 DIAGNOSIS — M940 Chondrocostal junction syndrome [Tietze]: Secondary | ICD-10-CM | POA: Diagnosis not present

## 2020-10-14 DIAGNOSIS — I10 Essential (primary) hypertension: Secondary | ICD-10-CM | POA: Diagnosis not present

## 2020-10-14 NOTE — Progress Notes (Signed)
Established patient visit   Patient: Eddie Evans   DOB: 05-Aug-1996   25 y.o. Male  MRN: [id number] Visit Date: 10/14/2020  Today's healthcare provider: Mila Merry, MD   Chief Complaint  Patient presents with  . Hypertension   Subjective    HPI  Hypertension, follow-up  BP Readings from Last 3 Encounters:  10/14/20 133/67  08/16/20 (!) 156/109  11/04/19 (!) 161/106   Wt Readings from Last 3 Encounters:  10/14/20 164 lb (74.4 kg)  11/04/19 161 lb (73 kg)  12/05/18 161 lb 3.2 oz (73.1 kg)     He was last seen for hypertension 2 months ago.  BP at that visit was 156/109. Management since that visit includes restarting Amlodipine- Benazepirl (patient had been out of medication) .  He reports good compliance with treatment. He is not having side effects.  He is following a Regular diet. He is not exercising. He does smoke (e cig).  Use of agents associated with hypertension: none.   Outside blood pressures are not checked. Symptoms: Yes chest pain No chest pressure  No palpitations No syncope  No dyspnea No orthopnea  No paroxysmal nocturnal dyspnea No lower extremity edema   Pertinent labs: Lab Results  Component Value Date   CHOL 163 08/16/2020   HDL 57 08/16/2020   LDLCALC 75 08/16/2020   TRIG 184 (H) 08/16/2020   CHOLHDL 2.9 08/16/2020   Lab Results  Component Value Date   NA 138 08/16/2020   K 3.8 08/16/2020   CREATININE 0.75 (L) 08/16/2020   GFRNONAA 129 08/16/2020   GFRAA 149 08/16/2020   GLUCOSE 92 08/16/2020     The ASCVD Risk score (Goff DC Jr., et al., 2013) failed to calculate for the following reasons:   The 2013 ASCVD risk score is only valid for ages 82 to 55   Also complains of persistent pain left of chest for the last three days. Worse when leaning over. Has been out of work due to pain and to home stress the last couple of days. No dyspnea.   ---------------------------------------------------------------------------------------------------     Medications: Outpatient Medications Prior to Visit  Medication Sig  . amLODipine-benazepril (LOTREL) 5-10 MG capsule Take 1 capsule by mouth daily.   No facility-administered medications prior to visit.    Review of Systems  Constitutional: Positive for appetite change (decreased appetite). Negative for chills and fever.  Respiratory: Negative for chest tightness, shortness of breath and wheezing.   Cardiovascular: Positive for chest pain (left side chest pain x 3 days). Negative for palpitations.  Gastrointestinal: Negative for abdominal pain, nausea and vomiting.  Psychiatric/Behavioral:       Increased stress      Objective    BP 133/67 (BP Location: Right Arm, Patient Position: Sitting, Cuff Size: Normal)   Pulse 87   Temp 98.6 F (37 C) (Temporal)   Resp 16   Wt 164 lb (74.4 kg)   BMI 27.29 kg/m    Physical Exam    General: Appearance:     Overweight male in no acute distress  Eyes:    PERRL, conjunctiva/corneas clear, EOM's intact       Lungs:     Clear to auscultation bilaterally, respirations unlabored  Heart:    Normal heart rate. Normal rhythm. No murmurs, rubs, or gallops.   MS:   All extremities are intact. Tender over left lower anterior ribs, reproducing chest pain he has been experiencing.   Neurologic:   Awake, alert,  oriented x 3. No apparent focal neurological           defect.         Assessment & Plan     1. Primary hypertension Fairly well controlled, Continue current medications.    2. Costochondritis Heat, ice, OTC NSAIDs per label. Note to return to work tomorrow.    He declined flu vaccine.       The entirety of the information documented in the History of Present Illness, Review of Systems and Physical Exam were personally obtained by me. Portions of this information were initially documented by the CMA and reviewed by me for  thoroughness and accuracy.      Mila Merry, MD  [facility] [phone] (phone) [phone] (fax)  [facility]

## 2020-11-08 ENCOUNTER — Other Ambulatory Visit: Payer: Self-pay | Admitting: Family Medicine

## 2020-11-08 DIAGNOSIS — I10 Essential (primary) hypertension: Secondary | ICD-10-CM

## 2021-03-16 ENCOUNTER — Ambulatory Visit: Payer: Self-pay | Admitting: *Deleted

## 2021-03-16 NOTE — Telephone Encounter (Signed)
Insect bite on right outer aspect of calf yesterday morning. No pain at the site but mild soreness just above.Blistered and drained last night. Area less red than last night-pinkish around the bite mark today.Does not feel warm, denies itching. No purplish area around bite mark. No red streak at this time. Center bite mark no longer fluid filled. Denies rash/SOB.Reviewed care advice. Keep clean and continue to apply antibiotic ointment for one-two days. Voiced understanding instructions.   Reason for Disposition  Non-serious spider bite  Answer Assessment - Initial Assessment Questions 1. TYPE of INSECT: "What type of insect was it?"      Spider possibly 2. ONSET: "When did you get bitten?"      Yesterday morning 3. LOCATION: "Where is the insect bite located?"      Right upper leg outer aspect 4. REDNESS: "Is the area red or pink?" If Yes, ask: "What size is area of redness?" (inches or cm). "When did the redness start?"     Pink today, less red than yesterday 5. PAIN: "Is there any pain?" If Yes, ask: "How bad is it?"  (Scale 1-10; or mild, moderate, severe)     Soreness above the bitten area 6. ITCHING: "Does it itch?" If Yes, ask: "How bad is the itch?"    - MILD: doesn't interfere with normal activities   - MODERATE-SEVERE: interferes with work, school, sleep, or other activities      No itching 7. SWELLING: "How big is the swelling?" (inches, cm, or compare to coins)     None reported 8. OTHER SYMPTOMS: "Do you have any other symptoms?"  (e.g., difficulty breathing, hives)     denies 9. PREGNANCY: "Is there any chance you are pregnant?" "When was your last menstrual period?"     na  Protocols used: Insect Bite-A-AH, Spider Bite - [employer]

## 2021-04-14 ENCOUNTER — Ambulatory Visit: Payer: BC Managed Care – PPO | Admitting: Family Medicine

## 2021-06-15 DIAGNOSIS — R112 Nausea with vomiting, unspecified: Secondary | ICD-10-CM | POA: Diagnosis not present

## 2021-06-15 DIAGNOSIS — J069 Acute upper respiratory infection, unspecified: Secondary | ICD-10-CM | POA: Diagnosis not present

## 2021-06-15 DIAGNOSIS — R0982 Postnasal drip: Secondary | ICD-10-CM | POA: Diagnosis not present

## 2022-01-22 ENCOUNTER — Other Ambulatory Visit: Payer: Self-pay | Admitting: Family Medicine

## 2022-01-22 DIAGNOSIS — I10 Essential (primary) hypertension: Secondary | ICD-10-CM

## 2022-06-24 ENCOUNTER — Other Ambulatory Visit: Payer: Self-pay | Admitting: Family Medicine

## 2022-06-24 DIAGNOSIS — I10 Essential (primary) hypertension: Secondary | ICD-10-CM

## 2022-07-18 DIAGNOSIS — I160 Hypertensive urgency: Secondary | ICD-10-CM | POA: Diagnosis not present

## 2022-07-18 DIAGNOSIS — I10 Essential (primary) hypertension: Secondary | ICD-10-CM | POA: Diagnosis not present

## 2022-07-18 DIAGNOSIS — J069 Acute upper respiratory infection, unspecified: Secondary | ICD-10-CM | POA: Diagnosis not present

## 2022-07-18 DIAGNOSIS — R079 Chest pain, unspecified: Secondary | ICD-10-CM | POA: Diagnosis not present

## 2022-07-20 ENCOUNTER — Ambulatory Visit: Payer: Self-pay | Admitting: Family Medicine

## 2022-07-20 NOTE — Progress Notes (Deleted)
      Established patient visit   Patient: Eddie Evans   DOB: 05/19/1996   26 y.o. Male  MRN: [id number] Visit Date: 07/20/2022  Today's healthcare provider: Lelon Huh, MD   No chief complaint on file.  Subjective    Cough    Hypertension, follow-up  BP Readings from Last 3 Encounters:  10/14/20 133/67  08/16/20 (!) 156/109  11/04/19 (!) 161/106   Wt Readings from Last 3 Encounters:  10/14/20 164 lb (74.4 kg)  11/04/19 161 lb (73 kg)  12/05/18 161 lb 3.2 oz (73.1 kg)     He was last seen for hypertension 9 months ago.  BP at that visit was 133/67. Management since that visit includes continue same medication.  He reports {excellent/good/fair/poor:19665} compliance with treatment. He {is/is not:9024} having side effects. {document side effects if present:1} He is following a {diet:21022986} diet. He {is/is not:9024} exercising. He {does/does not:200015} smoke.  Use of agents associated with hypertension: none.   Outside blood pressures are {***enter patient reported home BP readings, or 'not being checked':1}. Symptoms: {Yes/No:20286} chest pain {Yes/No:20286} chest pressure  {Yes/No:20286} palpitations {Yes/No:20286} syncope  {Yes/No:20286} dyspnea {Yes/No:20286} orthopnea  {Yes/No:20286} paroxysmal nocturnal dyspnea {Yes/No:20286} lower extremity edema   Pertinent labs Lab Results  Component Value Date   CHOL 163 08/16/2020   HDL 57 08/16/2020   LDLCALC 75 08/16/2020   TRIG 184 (H) 08/16/2020   CHOLHDL 2.9 08/16/2020   Lab Results  Component Value Date   NA 138 08/16/2020   K 3.8 08/16/2020   CREATININE 0.75 (L) 08/16/2020   GFRNONAA 129 08/16/2020   GLUCOSE 92 08/16/2020   TSH 1.79 09/26/2010     The ASCVD Risk score (Arnett DK, et al., 2019) failed to calculate for the following reasons:   The 2019 ASCVD risk score is only valid for ages 53 to  20  ---------------------------------------------------------------------------------------------------   Medications: Outpatient Medications Prior to Visit  Medication Sig   amLODipine-benazepril (LOTREL) 5-10 MG capsule TAKE 1 CAPSULE BY MOUTH EVERY DAY   No facility-administered medications prior to visit.    Review of Systems  Respiratory:  Positive for cough.     {Labs  Heme  Chem  Endocrine  Serology  Results Review (optional):23779}   Objective    There were no vitals taken for this visit. {Show previous vital signs (optional):23777}  Physical Exam  ***  No results found for any visits on 07/20/22.  Assessment & Plan     ***  No follow-ups on file.      {provider attestation***:1}   Lelon Huh, MD  [facility] [phone] (phone) [phone] (fax)  [place]

## 2022-08-06 ENCOUNTER — Other Ambulatory Visit: Payer: Self-pay | Admitting: Family Medicine

## 2022-08-06 DIAGNOSIS — I10 Essential (primary) hypertension: Secondary | ICD-10-CM

## 2022-09-05 ENCOUNTER — Inpatient Hospital Stay (HOSPITAL_COMMUNITY): Payer: BC Managed Care – PPO

## 2022-09-05 ENCOUNTER — Inpatient Hospital Stay (HOSPITAL_COMMUNITY)
Admission: EM | Admit: 2022-09-05 | Discharge: 2022-09-07 | DRG: 065 | Disposition: A | Discharge status: Home / Self Care | Payer: BC Managed Care – PPO | Attending: Neurology | Admitting: Neurology

## 2022-09-05 ENCOUNTER — Emergency Department: Payer: BC Managed Care – PPO

## 2022-09-05 ENCOUNTER — Other Ambulatory Visit: Payer: Self-pay

## 2022-09-05 ENCOUNTER — Encounter (HOSPITAL_COMMUNITY): Payer: Self-pay | Admitting: Neurology

## 2022-09-05 ENCOUNTER — Emergency Department
Admission: EM | Admit: 2022-09-05 | Discharge: 2022-09-05 | Disposition: A | Discharge status: Other Acute Inpatient Hospital | Payer: BC Managed Care – PPO | Attending: Emergency Medicine | Admitting: Emergency Medicine

## 2022-09-05 DIAGNOSIS — I619 Nontraumatic intracerebral hemorrhage, unspecified: Secondary | ICD-10-CM | POA: Diagnosis not present

## 2022-09-05 DIAGNOSIS — R531 Weakness: Secondary | ICD-10-CM | POA: Diagnosis not present

## 2022-09-05 DIAGNOSIS — I618 Other nontraumatic intracerebral hemorrhage: Secondary | ICD-10-CM | POA: Diagnosis not present

## 2022-09-05 DIAGNOSIS — I10 Essential (primary) hypertension: Secondary | ICD-10-CM | POA: Insufficient documentation

## 2022-09-05 DIAGNOSIS — R4701 Aphasia: Secondary | ICD-10-CM | POA: Diagnosis present

## 2022-09-05 DIAGNOSIS — Z8673 Personal history of transient ischemic attack (TIA), and cerebral infarction without residual deficits: Secondary | ICD-10-CM | POA: Diagnosis not present

## 2022-09-05 DIAGNOSIS — R4781 Slurred speech: Secondary | ICD-10-CM | POA: Diagnosis not present

## 2022-09-05 DIAGNOSIS — Z833 Family history of diabetes mellitus: Secondary | ICD-10-CM

## 2022-09-05 DIAGNOSIS — R471 Dysarthria and anarthria: Secondary | ICD-10-CM | POA: Diagnosis not present

## 2022-09-05 DIAGNOSIS — Z83438 Family history of other disorder of lipoprotein metabolism and other lipidemia: Secondary | ICD-10-CM | POA: Diagnosis not present

## 2022-09-05 DIAGNOSIS — Z8249 Family history of ischemic heart disease and other diseases of the circulatory system: Secondary | ICD-10-CM

## 2022-09-05 DIAGNOSIS — F17210 Nicotine dependence, cigarettes, uncomplicated: Secondary | ICD-10-CM | POA: Diagnosis present

## 2022-09-05 DIAGNOSIS — E785 Hyperlipidemia, unspecified: Secondary | ICD-10-CM | POA: Diagnosis present

## 2022-09-05 DIAGNOSIS — F419 Anxiety disorder, unspecified: Secondary | ICD-10-CM | POA: Diagnosis not present

## 2022-09-05 DIAGNOSIS — F32A Depression, unspecified: Secondary | ICD-10-CM | POA: Diagnosis not present

## 2022-09-05 DIAGNOSIS — R29810 Facial weakness: Secondary | ICD-10-CM | POA: Diagnosis present

## 2022-09-05 DIAGNOSIS — I161 Hypertensive emergency: Secondary | ICD-10-CM | POA: Diagnosis not present

## 2022-09-05 DIAGNOSIS — R000 Tachycardia, unspecified: Secondary | ICD-10-CM | POA: Diagnosis not present

## 2022-09-05 DIAGNOSIS — R29706 NIHSS score 6: Secondary | ICD-10-CM | POA: Diagnosis present

## 2022-09-05 DIAGNOSIS — I6389 Other cerebral infarction: Secondary | ICD-10-CM

## 2022-09-05 DIAGNOSIS — E876 Hypokalemia: Secondary | ICD-10-CM

## 2022-09-05 DIAGNOSIS — Z79899 Other long term (current) drug therapy: Secondary | ICD-10-CM

## 2022-09-05 DIAGNOSIS — I639 Cerebral infarction, unspecified: Secondary | ICD-10-CM | POA: Diagnosis not present

## 2022-09-05 DIAGNOSIS — I610 Nontraumatic intracerebral hemorrhage in hemisphere, subcortical: Principal | ICD-10-CM

## 2022-09-05 DIAGNOSIS — T465X6A Underdosing of other antihypertensive drugs, initial encounter: Secondary | ICD-10-CM | POA: Diagnosis present

## 2022-09-05 DIAGNOSIS — Z91148 Patient's other noncompliance with medication regimen for other reason: Secondary | ICD-10-CM | POA: Diagnosis not present

## 2022-09-05 DIAGNOSIS — G8191 Hemiplegia, unspecified affecting right dominant side: Secondary | ICD-10-CM | POA: Diagnosis not present

## 2022-09-05 DIAGNOSIS — I629 Nontraumatic intracranial hemorrhage, unspecified: Secondary | ICD-10-CM

## 2022-09-05 DIAGNOSIS — Z720 Tobacco use: Secondary | ICD-10-CM

## 2022-09-05 LAB — DIFFERENTIAL
Abs Immature Granulocytes: 0.04 10*3/uL (ref 0.00–0.07)
Basophils Absolute: 0.1 10*3/uL (ref 0.0–0.1)
Basophils Relative: 1 %
Eosinophils Absolute: 0.1 10*3/uL (ref 0.0–0.5)
Eosinophils Relative: 1 %
Immature Granulocytes: 1 %
Lymphocytes Relative: 30 %
Lymphs Abs: 2.5 10*3/uL (ref 0.7–4.0)
Monocytes Absolute: 0.5 10*3/uL (ref 0.1–1.0)
Monocytes Relative: 7 %
Neutro Abs: 5 10*3/uL (ref 1.7–7.7)
Neutrophils Relative %: 60 %

## 2022-09-05 LAB — CBC
HCT: 46.4 % (ref 39.0–52.0)
Hemoglobin: 15.5 g/dL (ref 13.0–17.0)
MCH: 29 pg (ref 26.0–34.0)
MCHC: 33.4 g/dL (ref 30.0–36.0)
MCV: 86.9 fL (ref 80.0–100.0)
Platelets: 293 10*3/uL (ref 150–400)
RBC: 5.34 MIL/uL (ref 4.22–5.81)
RDW: 12.8 % (ref 11.5–15.5)
WBC: 8.2 10*3/uL (ref 4.0–10.5)
nRBC: 0 % (ref 0.0–0.2)

## 2022-09-05 LAB — URINE DRUG SCREEN, QUALITATIVE (ARMC ONLY)
Amphetamines, Ur Screen: NOT DETECTED
Barbiturates, Ur Screen: NOT DETECTED
Benzodiazepine, Ur Scrn: NOT DETECTED
Cannabinoid 50 Ng, Ur ~~LOC~~: POSITIVE — AB
Cocaine Metabolite,Ur ~~LOC~~: NOT DETECTED
MDMA (Ecstasy)Ur Screen: NOT DETECTED
Methadone Scn, Ur: NOT DETECTED
Opiate, Ur Screen: NOT DETECTED
Phencyclidine (PCP) Ur S: NOT DETECTED
Tricyclic, Ur Screen: NOT DETECTED

## 2022-09-05 LAB — COMPREHENSIVE METABOLIC PANEL
ALT: 27 U/L (ref 0–44)
AST: 31 U/L (ref 15–41)
Albumin: 4 g/dL (ref 3.5–5.0)
Alkaline Phosphatase: 85 U/L (ref 38–126)
Anion gap: 11 (ref 5–15)
BUN: 14 mg/dL (ref 6–20)
CO2: 23 mmol/L (ref 22–32)
Calcium: 9.1 mg/dL (ref 8.9–10.3)
Chloride: 103 mmol/L (ref 98–111)
Creatinine, Ser: 0.83 mg/dL (ref 0.61–1.24)
GFR, Estimated: >60 mL/min (ref >60)
Glucose, Bld: 99 mg/dL (ref 70–99)
Potassium: 3.1 mmol/L — ABNORMAL LOW (ref 3.5–5.1)
Sodium: 137 mmol/L (ref 135–145)
Total Bilirubin: 0.8 mg/dL (ref 0.3–1.2)
Total Protein: 7.5 g/dL (ref 6.5–8.1)

## 2022-09-05 LAB — ECHOCARDIOGRAM COMPLETE
Height: 65 in
S' Lateral: 2.5 cm
Weight: 2666.68 oz

## 2022-09-05 LAB — URINALYSIS, ROUTINE W REFLEX MICROSCOPIC
Bilirubin Urine: NEGATIVE
Glucose, UA: NEGATIVE mg/dL
Hgb urine dipstick: NEGATIVE
Ketones, ur: NEGATIVE mg/dL
Leukocytes,Ua: NEGATIVE
Nitrite: NEGATIVE
Protein, ur: NEGATIVE mg/dL
Specific Gravity, Urine: 1.031 — ABNORMAL HIGH (ref 1.005–1.030)
pH: 7 (ref 5.0–8.0)

## 2022-09-05 LAB — HIV ANTIBODY (ROUTINE TESTING W REFLEX): HIV Screen 4th Generation wRfx: NONREACTIVE

## 2022-09-05 LAB — PROTIME-INR
INR: 1.1 (ref 0.8–1.2)
Prothrombin Time: 14.1 seconds (ref 11.4–15.2)

## 2022-09-05 LAB — APTT: aPTT: 31 seconds (ref 24–36)

## 2022-09-05 LAB — CBG MONITORING, ED: Glucose-Capillary: 105 mg/dL — ABNORMAL HIGH (ref 70–99)

## 2022-09-05 LAB — ETHANOL: Alcohol, Ethyl (B): <10 mg/dL (ref <10)

## 2022-09-05 MED ORDER — ACETAMINOPHEN 325 MG PO TABS
650.0000 mg | ORAL_TABLET | ORAL | Status: DC | PRN
  Administered 2022-09-05: 650 mg via ORAL
  Filled 2022-09-05: qty 2

## 2022-09-05 MED ORDER — NICARDIPINE HCL IN NACL 20-0.86 MG/200ML-% IV SOLN
INTRAVENOUS | Status: AC
  Administered 2022-09-05: 15 mg/h via INTRAVENOUS
  Filled 2022-09-05: qty 200

## 2022-09-05 MED ORDER — STROKE: EARLY STAGES OF RECOVERY BOOK
Freq: Once | Status: AC
  Filled 2022-09-05: qty 1

## 2022-09-05 MED ORDER — NICOTINE 21 MG/24HR TD PT24
21.0000 mg | MEDICATED_PATCH | Freq: Every day | TRANSDERMAL | Status: DC
  Administered 2022-09-05: 21 mg via TRANSDERMAL
  Filled 2022-09-05 (×3): qty 1

## 2022-09-05 MED ORDER — CHLORHEXIDINE GLUCONATE CLOTH 2 % EX PADS
6.0000 | MEDICATED_PAD | Freq: Every day | CUTANEOUS | Status: DC
  Administered 2022-09-06: 6 via TOPICAL

## 2022-09-05 MED ORDER — NICARDIPINE HCL IN NACL 20-0.86 MG/200ML-% IV SOLN
0.0000 mg/h | INTRAVENOUS | Status: DC
  Administered 2022-09-05: 5 mg/h via INTRAVENOUS
  Filled 2022-09-05: qty 200

## 2022-09-05 MED ORDER — IOHEXOL 350 MG/ML SOLN
100.0000 mL | Freq: Once | INTRAVENOUS | Status: AC | PRN
  Administered 2022-09-05: 100 mL via INTRAVENOUS

## 2022-09-05 MED ORDER — NICARDIPINE HCL IN NACL 20-0.86 MG/200ML-% IV SOLN
0.0000 mg/h | INTRAVENOUS | Status: DC
  Administered 2022-09-05: 15 mg/h via INTRAVENOUS
  Administered 2022-09-05: 2.5 mg/h via INTRAVENOUS
  Administered 2022-09-05: 5 mg/h via INTRAVENOUS
  Administered 2022-09-05: 11 mg/h via INTRAVENOUS
  Filled 2022-09-05 (×3): qty 200

## 2022-09-05 MED ORDER — ACETAMINOPHEN 160 MG/5ML PO SOLN: 650.0000 mg | ORAL | Status: DC | PRN

## 2022-09-05 MED ORDER — ACETAMINOPHEN 650 MG RE SUPP: 650.0000 mg | RECTAL | Status: DC | PRN

## 2022-09-05 MED ORDER — GADOBUTROL 1 MMOL/ML IV SOLN
7.5000 mL | Freq: Once | INTRAVENOUS | Status: AC | PRN
  Administered 2022-09-05: 7.5 mL via INTRAVENOUS

## 2022-09-05 MED ORDER — PANTOPRAZOLE SODIUM 40 MG IV SOLR
40.0000 mg | Freq: Every day | INTRAVENOUS | Status: DC
  Administered 2022-09-05: 40 mg via INTRAVENOUS
  Filled 2022-09-05: qty 10

## 2022-09-05 MED ORDER — LORAZEPAM 2 MG/ML IJ SOLN
INTRAMUSCULAR | Status: AC
  Administered 2022-09-05: 0.5 mg
  Filled 2022-09-05: qty 1

## 2022-09-05 MED ORDER — ORAL CARE MOUTH RINSE: 15.0000 mL | OROMUCOSAL | Status: DC | PRN

## 2022-09-05 MED ORDER — SENNOSIDES-DOCUSATE SODIUM 8.6-50 MG PO TABS
1.0000 | ORAL_TABLET | Freq: Two times a day (BID) | ORAL | Status: DC
  Administered 2022-09-05 – 2022-09-07 (×3): 1 via ORAL
  Filled 2022-09-05 (×5): qty 1

## 2022-09-05 MED ORDER — POTASSIUM CHLORIDE CRYS ER 20 MEQ PO TBCR
40.0000 meq | EXTENDED_RELEASE_TABLET | Freq: Once | ORAL | Status: AC
  Administered 2022-09-05: 40 meq via ORAL
  Filled 2022-09-05: qty 2

## 2022-09-05 MED ORDER — NICOTINE POLACRILEX 2 MG MT GUM: 2.0000 mg | CHEWING_GUM | OROMUCOSAL | Status: DC | PRN

## 2022-09-05 NOTE — ED Triage Notes (Signed)
Pt BIB [facility] from [facility]. EMS reported around 0630 or 0700 pt started having right arm weakness, facial droop. It was reported he is non compliance with his blood pressure medication. Pt was seen by neuro it was said no surgical intervention needed and he was place on Cardene.

## 2022-09-05 NOTE — ED Notes (Signed)
Cardene infusion stopped in [facility]. Report and handoff to [facility] who continued infusion for transport.

## 2022-09-05 NOTE — Progress Notes (Signed)
Clarified SBP parameter orders with MD Selina Cooley. SBP goal 130-150 per MD Selina Cooley.  Orders changed to reflect.

## 2022-09-05 NOTE — ED Notes (Signed)
2836 Off cart.  Pt started on nicardipine gtt by EDP and HOB elevated.

## 2022-09-05 NOTE — ED Notes (Signed)
This RN with pt to CT  

## 2022-09-05 NOTE — ED Notes (Signed)
0630 Dr Donella Stade joins cart. 0800 Pt returns to room.  Dr Arnoldo Morale to room to talk with family and provide report to family referenced CT images.  Dr Arnoldo Morale speaks with Dr Donella Stade via cart.

## 2022-09-05 NOTE — ED Notes (Signed)
Taken to MRI by this [employer] fundraiser. Currently on the monitor in MRI.

## 2022-09-05 NOTE — ED Notes (Signed)
BP remains high, cardene increased per order.

## 2022-09-05 NOTE — ED Provider Notes (Addendum)
[facility] Provider Note    Event Date/Time   First MD Initiated Contact with Patient 09/05/22 [phone]     (approximate)   History   Weakness   HPI  Eddie Evans is a 26 y.o. male past medical history significant for hypertension, who presents to the emergency department with weakness.  States that he woke up this morning at 530 in his normal state of health.  Walking out to his car in order to go to work and noticed that he was having weakness of his right arm and hand at approximately 630 this morning.  Started to notice a facial droop.  Glucose was in the 100s with EMS.  Patient has been noncompliant with his antihypertensive medications over the cast couple of weeks and states that he was "trying to save money over the holiday".  Denies any falls or trauma.  Endorses daily alcohol and marijuana use.  Denies any cocaine use.     Physical Exam   Triage Vital Signs: ED Triage Vitals  Enc Vitals Group     BP 09/05/22 0729 (!) 177/125     Pulse Rate 09/05/22 0729 92     Resp 09/05/22 0729 18     Temp 09/05/22 0729 98.9 F (37.2 C)     Temp Source 09/05/22 0729 Oral     SpO2 09/05/22 0729 95 %     Weight 09/05/22 0730 162 lb 0.6 oz (73.5 kg)     Height 09/05/22 0730 5\' 5"  (1.651 m)     Head Circumference --      Peak Flow --      Pain Score --      Pain Loc --      Pain Edu? --      Excl. in GC? --     Most recent vital signs: Vitals:   09/05/22 0820 09/05/22 0920  BP: (!) 176/122 139/80  Pulse: (!) 112 (!) 134  Resp: 18 (!) 30  Temp:    SpO2: 95% 100%    Physical Exam Constitutional:      Appearance: He is well-developed.  HENT:     Head: Atraumatic.  Eyes:     Conjunctiva/sclera: Conjunctivae normal.  Cardiovascular:     Rate and Rhythm: Regular rhythm.  Pulmonary:     Effort: No respiratory distress.  Musculoskeletal:     Cervical back: Normal range of motion.  Skin:    General: Skin is warm.  Neurological:     Mental  Status: He is alert. Mental status is at baseline.     GCS: GCS eye subscore is 4. GCS verbal subscore is 5. GCS motor subscore is 6.     Coordination: Coordination is intact.     Comments: Drooling.  Facial droop with forehead sparing.  Pronator drift to the right upper extremity with 4+/5 strength.  Normal sensation throughout.  5/5 strength of bilateral lower extremities.     IMPRESSION / MDM / ASSESSMENT AND PLAN / ED COURSE  I reviewed the triage vital signs and the nursing notes.  On arrival to the emergency department patient is within the window for TNK, activated code stroke given strokelike symptoms within 3-hour window.  Last known normal 630 this morning (1 hour prior to arrival).  Immediately taken back to CT scanner.  Ordered CTA CT perfusion study given NIH scale of 4.  Initial CT scan independently reviewed with concern for intraparenchymal hemorrhage.  Discussed with CT scan to switch to CT  angiography head and neck and to discontinue perfusion study given no concern for an ischemic stroke.  Stated they would switch these images.  Significantly hypertensive on arrival.  Ordered Cardene infusion after recommendation with neurology Dr. Donella Stade.  Head of bed 30 degrees.  Recommended reaching out to neurosurgery however did not feel that there is any intervention necessary and would be more of a formality.  Reached out to neurosurgery and discussed with Dr. Marcell Barlow who felt that neurology would be able to manage this patient and that there was no surgical intervention necessary at this time given his intraparenchymal hemorrhage -recommended blood pressure control.  After discussion with the stroke coordinator likely will need transfer to neuro ICU at [facility].  Discussing with Dr. Selina Cooley  EKG  I, Corena Herter, the attending physician, personally viewed and interpreted this ECG.   Rate: Normal  Rhythm: Normal sinus  Axis: Normal  Intervals: Normal  ST&T Change: None  Sinus  tachycardia while on cardiac telemetry.  RADIOLOGY I independently reviewed imaging, my interpretation of imaging: Left intraparenchymal hemorrhage.  Called and discussed with the radiologist.  Also discussed CTA imaging with out an obvious aneurysm or ongoing bleeding.  LABS (all labs ordered are listed, but only abnormal results are displayed) Labs interpreted as -   No signs of coagulopathy Labs Reviewed  COMPREHENSIVE METABOLIC PANEL - Abnormal; Notable for the following components:      Result Value   Potassium 3.1 (*)    All other components within normal limits  URINE DRUG SCREEN, QUALITATIVE ([facility] ONLY) - Abnormal; Notable for the following components:   Cannabinoid 50 Ng, Ur [place] POSITIVE (*)    All other components within normal limits  URINALYSIS, ROUTINE W REFLEX MICROSCOPIC - Abnormal; Notable for the following components:   Color, Urine STRAW (*)    APPearance CLEAR (*)    Specific Gravity, Urine 1.031 (*)    All other components within normal limits  CBG MONITORING, ED - Abnormal; Notable for the following components:   Glucose-Capillary 105 (*)    All other components within normal limits  PROTIME-INR  APTT  CBC  DIFFERENTIAL  ETHANOL    TREATMENT   Cardene infusion  After started on Cardene infusion continued to be anxious so given IV Ativan.  Cardene infusion was uptitrated at 15 and maintained a blood pressure of 130/80.  Every hour neurochecks.  Plan to transfer to [facility] neuro ICU  No beds at [facility] neuro ICU so recommended transfer to the emergency department, accepted in transfer with Dr. Wilkie Aye to the emergency department.   PROCEDURES:  Critical Care performed: Yes  .Critical Care  Performed by: Corena Herter, MD Authorized by: Corena Herter, MD   Critical care provider statement:    Critical care time (minutes):  80   Critical care time was exclusive of:  Separately billable procedures and treating other patients   Critical care was necessary  to treat or prevent imminent or life-threatening deterioration of the following conditions:  CNS failure or compromise   Critical care was time spent personally by me on the following activities:  Development of treatment plan with patient or surrogate, discussions with consultants, evaluation of patient's response to treatment, examination of patient, ordering and review of laboratory studies, ordering and review of radiographic studies, ordering and performing treatments and interventions, pulse oximetry, re-evaluation of patient's condition and review of old charts   Patient's presentation is most consistent with acute presentation with potential threat to life or bodily function.  MEDICATIONS ORDERED IN ED: Medications  nicardipine (CARDENE) 20mg  in 0.86% saline IV infusion (0.1 mg/ml) (15 mg/hr Intravenous New Bag/Given 09/05/22 0944)  iohexol (OMNIPAQUE) 350 MG/ML injection 100 mL (100 mLs Intravenous Contrast Given 09/05/22 0805)  LORazepam (ATIVAN) 2 MG/ML injection (0.5 mg  Given 09/05/22 0836)    FINAL CLINICAL IMPRESSION(S) / ED DIAGNOSES   Final diagnoses:  Intraparenchymal hemorrhage of brain [facility])     Rx / DC Orders   ED Discharge Orders          Ordered    Neuro checks        09/05/22 0849             Note:  This document was prepared using Dragon voice recognition software and may include unintentional dictation errors.   09/07/22, MD 09/05/22 09/07/22    9937, MD 09/05/22 1024

## 2022-09-05 NOTE — ED Triage Notes (Signed)
Pt woke up feeling normal at 6am, around 6:30 he felt weakness on the right side, right sided facial droop and GF told EMS he has slurred speech from baseline.  Denies headaches and takes BP medication but has not been taking.

## 2022-09-05 NOTE — ED Notes (Signed)
Teleneuro at bedside 

## 2022-09-05 NOTE — ED Notes (Signed)
Received verbal report from Katrina RN at this time 

## 2022-09-05 NOTE — Progress Notes (Signed)
  Echocardiogram 2D Echocardiogram has been performed.  Milda Smart 09/05/2022, 5:11 PM

## 2022-09-05 NOTE — ED Notes (Signed)
BP remains high, cardene increased as ordered  

## 2022-09-05 NOTE — Consult Note (Signed)
[facility] TeleNeurology Consult Services   Patient Name:   Eddie Evans, Eddie Evans Date of Birth:   [birth year] Identification Number:   MRN - [id number] Date of Service:   09/05/2022 07:52:00  Diagnosis:       I61.0 - Intracerebral hemorrhage in hemisphere, subcortical  Impression: 26 year old male with history of uncontrolled HTN, tobacco and EtOH abuse presenting with small acute intracerebral hemorrhage in left basal ganglia/internal capsule resulting in right hemiparesis, dysarthria +/- mild aphasia, likely hypertensive etiology. No spot sign, vascular malformation or other significant abnormality on CTA.  Recommendation:  Diagnostic Studies:      Repeat CT head in first 8-12hrs  Medications:       Hold?antiplatelet?therapy/NSAIDS/Anticoagulation  Nursing Recommendations:       Telemetry, IV Fluids?Avoid dextrose containing fluids, Maintain euglycemia       Head of bed 30 degrees       Neuro checks q1-2?hrs?during ICU stay       Once stable neuro checks q4?hrs       keep BP less than 140/90's with goal of 130/80s  Consultations:       Need Neurosurgery consultation?STAT       Recommend Speech therapy if failed dysphagia screen       Physical therapy/Occupational therapy  DVT Prophylaxis:       SCDs  Disposition:       Neurology will Follow  ------------------------------------------------------------------------------  Metrics: Last Known Well: 09/05/2022 05:55:00 [facility] Notification Time: 09/05/2022 07:52:00 Arrival Time: 09/05/2022 07:30:00 Stamp Time: 09/05/2022 07:52:00 Initial Response Time: 09/05/2022 07:56:03 Symptoms: Right sided weakness. Initial patient interaction: 09/05/2022 08:05:00 NIHSS Assessment Completed: 09/05/2022 08:10:00 Patient is not a candidate for Thrombolytic. Thrombolytic Medical Decision: 09/05/2022 08:10:00 Patient was not deemed candidate for Thrombolytic because of following reasons: Current or Previous  ICH.  I personally reviewed the CT Head and it showed acute intraparenchymal hematoma in left globus pallidus/internal capsule area with rim of surrounding vasogenic edema, no acute ischemia appreciated. CTA negative for spot sign or other vascular abnormality.  Primary Provider Notified of Diagnostic Impression and Management Plan on: 09/05/2022 08:23:29    History of Present Illness: Patient is a 26 year old Male.  Patient was brought by EMS for symptoms of Right sided weakness. Patient is a 26 year old male with uncontrolled HTN, tobacco and EtOH abuse presenting with right sided weakness. Patient states he woke up normal at 0555 this morning, then when he went to get in his car to drive to work his right hand got weak. Patient does use EtOH and cannabis daily. He has been prescribed anti-HTN medications but does not take them regularly. He did have a headache last night and took Goody's powder (contains aspirin 520 mg). Patient denies any vision changes, numbness, or tingling.   Past Medical History:      Hypertension      There is no history of Stroke      There is no history of Seizures  Medications:  No Anticoagulant use  Antiplatelet use: Yes aspirin (Goody powder) Reviewed EMR for current medications  Allergies:  Reviewed  Social History: Smoking: Yes Alcohol Use: Yes Drug Use: Yes  Family History:  There is no family history of premature cerebrovascular disease pertinent to this consultation  ROS : 14 Points Review of Systems was performed and was negative except mentioned in HPI.  Past Surgical History: There Is No Surgical History Contributory To Today's Visit    Examination: BP(177/125), Pulse(77), Blood Glucose(105) 1A: Level of Consciousness -  Alert; keenly responsive + 0 1B: Ask Month and Age - Both Questions Right + 0 1C: Blink Eyes & Squeeze Hands - Performs Both Tasks + 0 2: Test Horizontal Extraocular Movements - Normal + 0 3: Test Visual Fields  - No Visual Loss + 0 4: Test Facial Palsy (Use Grimace if Obtunded) - Partial paralysis (lower face) + 2 5A: Test Left Arm Motor Drift - No Drift for 10 Seconds + 0 5B: Test Right Arm Motor Drift - Drift, but doesn't hit bed + 1 6A: Test Left Leg Motor Drift - No Drift for 5 Seconds + 0 6B: Test Right Leg Motor Drift - Drift, but doesn't hit bed + 1 7: Test Limb Ataxia (FNF/Heel-Shin) - No Ataxia + 0 8: Test Sensation - Normal; No sensory loss + 0 9: Test Language/Aphasia - Mild-Moderate Aphasia: Some Obvious Changes, Without Significant Limitation + 1 10: Test Dysarthria - Mild-Moderate Dysarthria: Slurring but can be understood + 1 11: Test Extinction/Inattention - No abnormality + 0 NIHSS Score: 6  ICH Score: 0  NIHSS Text : Very mild right arm and leg drift, minimal aphasia may be within normal limits.   GlasGow Coma Score: 13-15 (0)  Age >= 80: No (0)  ICH volume >= 60mL: No (0)  Intraventricular hemorrhage: No (0)  Infratentorial origin of hemorrhage: No (0)  Pre-Morbid Modified Rankin Scale: 0 Points = No symptoms at all   Patient/Family was informed the Neurology Consult would occur via TeleHealth consult by way of interactive audio and video telecommunications and consented to receiving care in this manner.   Dr. Meta Hatchet  [facility] For Inpatient follow-up with [facility] physician please call [facility] [phone]. This is not an outpatient service. Post hospital discharge, please contact hospital directly.  Please do not communicate with [facility] physicians via secure chat. If you have any questions, Please contact [facility].

## 2022-09-05 NOTE — H&P (Addendum)
Neurology H&P  CC: ICH  History is obtained from:patient and chart.  HPI: Eddie Evans is a 26 y.o. male with hx uncontrolled HTN who presented at approx 0700 this AM to [facility] ED with right hemiparesis, dysarthria, minimal aphasia and was found to have a small acute intracerebral hemorrhage in the left corona radiata.   Etiology is felt to be uncontrolled hypertension. BP 177/125 on arrival, now controlled at goal SBP<150 on cardene. Exam is unchanged from documented exam at [facility] before transfer.  Denies any substance use, did not take his BP meds all of last week. Smokes, drinks 2 bottles of beers a day, uses recreational marijuana. He is not on blood thinners and did not use cocaine.  LKW: 0600 tpa given?: No, ICH IR Thrombectomy? No, ICH ICH score: 0 Modified Rankin Scale: 0-Completely asymptomatic and back to baseline post- stroke NIHSS: 6 NIHSS components Score: Comment  1a Level of Conscious 0[x]  1[]  2[]  3[]      1b LOC Questions 0[x]  1[]  2[]       1c LOC Commands 0[x]  1[]  2[]       2 Best Gaze 0[x]  1[]  2[]       3 Visual 0[x]  1[]  2[]  3[]      4 Facial Palsy 0[]  1[]  2[x]  3[]      5a Motor Arm - left 0[x]  1[]  2[]  3[]  4[]  UN[]    5b Motor Arm - Right 0[]  1[x]  2[]  3[]  4[]  UN[]    6a Motor Leg - Left 0[x]  1[]  2[]  3[]  4[]  UN[]    6b Motor Leg - Right 0[]  1[x]  2[]  3[]  4[]  UN[]    7 Limb Ataxia 0[x]  1[]  2[]  3[]  UN[]     8 Sensory 0[x]  1[]  2[]  UN[]      9 Best Language 0[x]  1[]  2[]  3[]      10 Dysarthria 0[]  1[]  2[x]  UN[]      11 Extinct. and Inattention 0[x]  1[]  2[]       TOTAL: 7 6      ROS: A complete ROS was performed and is negative except as noted in the HPI.    Past Medical History:  Diagnosis Date   Anxiety    Hypertension      Family History  Problem Relation Age of Onset   Diabetes Mother    Hyperlipidemia Mother    Hypertension Mother     Social History:  reports that he has been smoking cigarettes and e-cigarettes. He has been smoking an average of .5 packs per day.  He has quit using smokeless tobacco.  His smokeless tobacco use included snuff and chew. He reports current alcohol use. He reports current drug use. Drug: Marijuana.   Prior to Admission medications   Medication Sig Start Date End Date Taking? Authorizing Provider  amLODipine-benazepril (LOTREL) 5-10 MG capsule TAKE 1 CAPSULE BY MOUTH EVERY DAY Patient taking differently: Take 1 capsule by mouth daily. 08/06/22  Yes Malva Limes, MD     Exam: Current vital signs: BP 114/61 (BP Location: Left Arm)   Pulse (!) 111   Temp 98.6 F (37 C) (Oral)   Resp 17   Ht 5\' 5"  (1.651 m)   Wt 75.6 kg   SpO2 96%   BMI 27.73 kg/m    Physical Exam  General: drowsy, oriented, no acute distress.  Constitutional: Appears well-developed and well-nourished.  Psych: Affect appropriate to situation Eyes: No scleral injection HEENT: Atraumatic, normocephalic.  Cardiovascular: Normal rate and regular rhythm. Appears well-perfused. Respiratory: Symmetric chest rise, unlabored breathing  GI: Soft. No distension.There is no  tenderness.  Skin: WDI  Neuro: Mental Status: Drowsy, oriented to person, place, time, situation.  Speech:  moderate dysarthria with speech apraxia but no obvious word finding difficulty. Follow commands, identifies objects. Cranial Nerves: II: Visual Fields are full. Pupils are equal, round, and reactive to light.   III,IV, VI: EOMI without ptosis or nystagmus V: Facial sensation is symmetric to light touch VII:R facial droop VIII: hearing is intact to voice X: Uvula is midline and palate elevates symmetrically XI: Shoulder shrug is symmetric. XII: tongue is midline without atrophy or fasciculations.  Motor: Tone is normal. Bulk is normal.  RUE: drift RLE: slight drift LLE/LUE: 5/5, no drift.  Sensory: Sensation is symmetric to light touch in the arms and legs. Cerebellar: No ataxia out of proportion to weakness on the right side.  I have reviewed labs in epic and  the pertinent results are: K 3.1 --> will replace   I have reviewed the images obtained: CTH: Acute hemorrhage in the left corona radiata measuring 2 x 1 x 1 cm. CTAngio Head & Neck: No intracranial large vessel occlusion or proximal high-grade arterial stenosis.No abnormal arterial enhancement is demonstrated in the region of the left corona radiata hemorrhage to suggest an underlying vascular lesion. Repeat CTH: pending.   Primary Diagnosis:  Nontraumatic intracerebral hemorrhage in hemisphere, subcortical  Secondary Diagnosis: Essential (primary) hypertension Medication Non-compliance   Impression:  This is a 26 yo patient with hx uncontrolled HTN who presented at approx 0700 this AM to [facility] ED with right hemiparesis, dysarthria, minimal aphasia and was found to have a small acute intracerebral hemorrhage in the left corona radiata. Etiology is felt to be uncontrolled hypertension.    #Non-traumatic Left Corona radiata subcortical Intracranial hemorrhage with Right sided weakness and Right facial droop #Hypertensive emergency. #medication non compliance #tobacco use #hypokalemia.   Plan: - Admit to ICU - Stability scan in 6 hours or STAT with any neurological decline - continue Cardene for SBP goal 130-150; currently within parameters - Frequent neuro checks; q39min for 1 hour, then q1hour - No antiplatelets or anticoagulants due to ICH - SCD for DVT prophylaxis, pharmacological DVT ppx at 24 hours if ICH is stable - Stroke labs, HgbA1c, fasting lipid panel - MRI brain with and without contrast when stabilized to evaluate for underlying mass - Risk factor modification - Echocardiogram - PT consult, OT consult, Speech consult. - Stroke team to follow  Hypertensive Emergency: - SBP goal as above. Hold home meds. Will use Cardene gtt for BP control  Tobacco use: smokes cigarettes and vapes. - counseled on the importance of quitting tobacco to reduce risk of strokes in  the future. He is willing to cut down on tobacco going forward.  Hpokalemia: - repeat chem in AM - replace K as needed.  Pt seen by Neuro NP/APP and later by MD. Note/plan to be edited by MD as needed.    Lynnae January, DNP, AGACNP-BC [facility] Please use AMION for pager and EPIC for messaging  NEUROHOSPITALIST ADDENDUM Performed a face to face diagnostic evaluation.   I have reviewed the contents of history and physical exam as documented by PA/ARNP/Resident and agree with above documentation.  I have discussed and formulated the above plan as documented. Edits to the note have been made as needed.  This patient is critically ill and at significant risk of neurological worsening, death and care requires constant monitoring of vital signs, hemodynamics,respiratory and cardiac monitoring, neurological assessment, discussion with family, other specialists and  medical decision making of high complexity. I spent 45 minutes of neurocritical care time  in the care of  this patient. This was time spent independent of any time provided by nurse practitioner or PA.  Erick Blinks [facility] Pager Number [phone] 09/05/2022  1:51 PM   Erick Blinks, MD [facility] [phone]   If 7pm to 7am, please call on call as listed on AMION.

## 2022-09-05 NOTE — ED Notes (Signed)
8242 Cart preactivated for pt with R side weakness as reported by incoming EMS 0730 Pt arrives to ED. Alert oriented and appropriate.  Reports waking up fine but at appx 630 while trying to get in his car he noted weakness to r hand.  EDP performs NIH.  NIH 3 as observed by this RN.  Pt is r hand dominant.  Request is made for TNK to be prepulled.  0737 Pt to CT.

## 2022-09-05 NOTE — ED Provider Notes (Signed)
Patient transferred from [facility] to be admitted to the neuro ICU for an intracerebral hemorrhage.  Patient denies any new complaints at this time.  Patient is currently on a Cardene drip.  I spoke with Dr. Derry Lory, neurohospitalist.  His service will be admitting the patient and will be down to see the patient shortly   Linwood Dibbles, MD 09/05/22 1110

## 2022-09-05 NOTE — ED Notes (Signed)
Called report to Claud Kelp RN

## 2022-09-05 NOTE — ED Notes (Signed)
BP remains high, cardene increased as ordered- max dose of 15mg /hr.

## 2022-09-05 NOTE — Progress Notes (Addendum)
Neurology progress note  This is a 26 yo patient with hx uncontrolled HTN who presented at approx 0700 this AM to [facility] ED with right hemiparesis, dysarthria, minimal aphasia and was found to have a small acute intracerebral hemorrhage in the left corona radiata.  Etiology is felt to be uncontrolled hypertension. BP 177/125 on arrival, now controlled at goal SBP<150 on cardene. Exam is unchanged from telestroke examination except that patient is slightly drowsy.  Physical Exam Gen: mildly lethargic, Ox4, NAD HEENT: Atraumatic, normocephalic; oropharynx clear, tongue without atrophy or fasciculations. Resp: CTAB, normal work of breathing CV: RRR, extremities appear well-perfused. Abd: soft/NT/ND Extrem: Nml bulk; no cyanosis, clubbing, or edema.  Neuro: *MS: mildly lethargic, Ox4, NAD *Speech: mild dysarthria, mild word finding difficulty in conversation but able to name and repeat *CN:    I: Deferred   II,III: PERRLA, VFF by confrontation, optic discs not visualized 2/2 pupillary constriction   III,IV,VI: EOMI w/o nystagmus, no ptosis   V: Sensation intact from V1 to V3 to LT   VII: Eyelid closure was full.  R UMN facial droop   VIII: Hearing intact to voice   IX,X: Voice normal, palate elevates symmetrically    XI: SCM/trap 5/5 bilat   XII: Tongue protrudes midline, no atrophy or fasciculations  *Motor:   Normal bulk.  No tremor, rigidity or bradykinesia. Drift but not to bed RUE and RLE. LUE and LLE full strength *Sensory: SILT. No double-simultaneous extinction.  *Coordination:  FNF intact bilat *Reflexes:  2+ and symmetric throughout without clonus; toes down-going bilat *Gait: deferred  NIHSS 1a Level of Conscious.: 1 1b LOC Questions: 0 1c LOC Commands: 0 2 Best Gaze: 0 3 Visual: 0 4 Facial Palsy: 2 5a Motor Arm - left: 0 5b Motor Arm - Right: 1 6a Motor Leg - Left:0 6b Motor Leg - Right: 1 7 Limb Ataxia: 0 8 Sensory: 0 9 Best Language: 1 10 Dysarthria: 1 11  Extinct. and Inatten.: 0  TOTAL: 7 (w/ telestroke at 0800 was 6); drowsiness is minimal  A/P: This is a 26 yo patient with hx uncontrolled HTN who presented at approx 0700 this AM to [facility] ED with right hemiparesis, dysarthria, minimal aphasia and was found to have a small acute intracerebral hemorrhage in the left corona radiata. Etiology is felt to be uncontrolled hypertension.   - Patient will need to be admitted at [facility] to 4N neuro ICU under Dr. Marchelle Folks - There are currently no 4N beds available so patient will be transferred ED to ED at [facility]. [facility] notified, will be en route shortly - Patient is on cardene for goal SBP 130-150; currently at goal - Patient is not on anticoagulation, nothing to reverse - SCDs for DVT prophylaxis - Head CT q 6 hrs assess stability of ICH - HOB elevated 30 degrees - No indication for neurosurgery consult at this time - D/w Dr. Ileene Rubens neurology and Dr. Arnoldo Morale [facility] ED - Please notify Dr. Derry Lory on patient arrival to [facility]. Page me for any questions or concerns while patient is at [facility] awaiting transfer  Bing Neighbors, MD [facility] [phone]  If 7pm- 7am, please page neurology on call as listed in AMION.  Same day note - no charge

## 2022-09-05 NOTE — ED Notes (Signed)
BP remains high, cardene increased as ordered

## 2022-09-05 NOTE — ED Notes (Signed)
Pt went to CT

## 2022-09-05 NOTE — ED Notes (Signed)
Cardene initiated due to hypertension.

## 2022-09-05 NOTE — ED Notes (Addendum)
Returned from CT. Tele neuro at bedside.

## 2022-09-05 NOTE — Progress Notes (Signed)
Responded to Code Stroke page. Upon arrival patient being cared for by medical team. No family present.

## 2022-09-06 DIAGNOSIS — F32A Depression, unspecified: Secondary | ICD-10-CM | POA: Insufficient documentation

## 2022-09-06 DIAGNOSIS — I610 Nontraumatic intracerebral hemorrhage in hemisphere, subcortical: Secondary | ICD-10-CM | POA: Diagnosis not present

## 2022-09-06 LAB — BASIC METABOLIC PANEL
Anion gap: 13 (ref 5–15)
BUN: 8 mg/dL (ref 6–20)
CO2: 21 mmol/L — ABNORMAL LOW (ref 22–32)
Calcium: 9.4 mg/dL (ref 8.9–10.3)
Chloride: 102 mmol/L (ref 98–111)
Creatinine, Ser: 0.75 mg/dL (ref 0.61–1.24)
GFR, Estimated: >60 mL/min (ref >60)
Glucose, Bld: 99 mg/dL (ref 70–99)
Potassium: 3.4 mmol/L — ABNORMAL LOW (ref 3.5–5.1)
Sodium: 136 mmol/L (ref 135–145)

## 2022-09-06 LAB — LIPID PANEL
Cholesterol: 155 mg/dL (ref 0–200)
HDL: 48 mg/dL (ref >40)
LDL Cholesterol: 80 mg/dL (ref 0–99)
Total CHOL/HDL Ratio: 3.2 RATIO
Triglycerides: 134 mg/dL (ref <150)
VLDL: 27 mg/dL (ref 0–40)

## 2022-09-06 LAB — MRSA NEXT GEN BY PCR, NASAL: MRSA by PCR Next Gen: NOT DETECTED

## 2022-09-06 MED ORDER — BENAZEPRIL HCL 5 MG PO TABS
10.0000 mg | ORAL_TABLET | Freq: Every day | ORAL | Status: DC
  Administered 2022-09-06 – 2022-09-07 (×2): 10 mg via ORAL
  Filled 2022-09-06 (×2): qty 2

## 2022-09-06 MED ORDER — PANTOPRAZOLE SODIUM 40 MG PO TBEC
40.0000 mg | DELAYED_RELEASE_TABLET | Freq: Every day | ORAL | Status: DC
  Administered 2022-09-06: 40 mg via ORAL
  Filled 2022-09-06: qty 1

## 2022-09-06 MED ORDER — AMLODIPINE BESYLATE 5 MG PO TABS
5.0000 mg | ORAL_TABLET | Freq: Every day | ORAL | Status: DC
  Administered 2022-09-06 – 2022-09-07 (×2): 5 mg via ORAL
  Filled 2022-09-06 (×2): qty 1

## 2022-09-06 MED ORDER — POTASSIUM CHLORIDE CRYS ER 20 MEQ PO TBCR
40.0000 meq | EXTENDED_RELEASE_TABLET | Freq: Once | ORAL | Status: AC
  Administered 2022-09-06: 40 meq via ORAL
  Filled 2022-09-06: qty 2

## 2022-09-06 MED ORDER — NICARDIPINE HCL IN NACL 20-0.86 MG/200ML-% IV SOLN: 0.0000 mg/h | INTRAVENOUS | Status: DC

## 2022-09-06 NOTE — Consult Note (Addendum)
CODE STROKE- PHARMACY COMMUNICATION   Time CODE STROKE called/page received: 0711 (20 min ETA)  Time response to CODE STROKE was made in person: 0745   Time Stroke Kit retrieved from [facility] (only if needed): N/A (hemorrhagic)  Name of Provider/Nurse contacted: Nathaniel Man, MD  Past Medical History:  Diagnosis Date   Anxiety    Hypertension    Prior to Admission medications   Medication Sig Start Date End Date Taking? Authorizing Provider  amLODipine-benazepril (LOTREL) 5-10 MG capsule TAKE 1 CAPSULE BY MOUTH EVERY DAY Patient taking differently: Take 1 capsule by mouth daily. 08/06/22  Yes Birdie Sons, MD   Gretel Acre, PharmD PGY1 Pharmacy Resident 09/05/2022 8:27 AM

## 2022-09-06 NOTE — Progress Notes (Signed)
Notified Dr Amada Jupiter that patients BP was 110/82 outside of BP parameters 130-150.  VO to allow for lower BP unless neurological change or systolic <90 (map<65).  No neurological change and VSS.

## 2022-09-06 NOTE — Evaluation (Signed)
Physical Therapy Evaluation Patient Details Name: Eddie Evans MRN: [id number] DOB: 03/26/1996 Today's Date: 09/06/2022  History of Present Illness  26 y.o. male with hx uncontrolled HTN who presented at approx 0700 this AM to [facility] ED with right hemiparesis, dysarthria, minimal aphasia and was found to have a small acute intracerebral hemorrhage in the left corona radiata.  Clinical Impression  Pt admitted with/for s/s of stroke with R hemiparesis, resolving mostly into the R UE with mild aphasia.  Pt needing min guard assist overall..  Pt currently limited functionally due to the problems listed below.  (see problems list.)  Pt will benefit from PT to maximize function and safety to be able to get home safely with available assist .        Recommendations for follow up therapy are one component of a multi-disciplinary discharge planning process, led by the attending physician.  Recommendations may be updated based on patient status, additional functional criteria and insurance authorization.  Follow Up Recommendations Home health PT      Assistance Recommended at Discharge Intermittent Supervision/Assistance  Patient can return home with the following  A little help with walking and/or transfers    Equipment Recommendations None recommended by PT  Recommendations for Other Services       Functional Status Assessment Patient has had a recent decline in their functional status and/or demonstrates limited ability to make significant improvements in function in a reasonable and predictable amount of time     Precautions / Restrictions Precautions Precautions: Fall      Mobility  Bed Mobility Overal bed mobility: Modified Independent                  Transfers Overall transfer level: Needs assistance   Transfers: Sit to/from Stand Sit to Stand: Min guard, Supervision           General transfer comment: no assist needed.    Ambulation/Gait Ambulation/Gait  assistance: Min guard Gait Distance (Feet): 300 Feet Assistive device: None Gait Pattern/deviations: Step-through pattern, Decreased step length - right, Decreased step length - left, Decreased stride length   Gait velocity interpretation: >2.62 ft/sec, indicative of community ambulatory   General Gait Details: generally steady, guarded with abrupt change in direction, backing up, Able to attain age-appropriate gait speeds  Stairs Stairs: Yes   Stair Management: One rail Left, Alternating pattern, Forwards Number of Stairs: 12 General stair comments: safe with rail  Wheelchair Mobility    Modified Rankin (Stroke Patients Only) Modified Rankin (Stroke Patients Only) Pre-Morbid Rankin Score: No symptoms Modified Rankin: Moderate disability     Balance Overall balance assessment: Needs assistance Sitting-balance support: Single extremity supported, No upper extremity supported, Feet supported Sitting balance-Leahy Scale: Fair       Standing balance-Leahy Scale: Fair                               Pertinent Vitals/Pain Pain Assessment Pain Assessment: Faces Faces Pain Scale: No hurt Pain Intervention(s): Monitored during session    Home Living Family/patient expects to be discharged to:: Private residence Living Arrangements: Spouse/significant other;Children Available Help at Discharge: Friend(s);Family;Other (Comment) (significant other) Type of Home: House Home Access: Stairs to enter Entrance Stairs-Rails: Doctor, general practice of Steps: 3   Home Layout: One level Home Equipment: None      Prior Function Prior Level of Function : Independent/Modified Independent  Mobility Comments: work as Surveyor, [employer] in [employer] ADLs Comments: girlfriend supervises finances/medication; works in Automotive engineer   Dominant Hand: Right    Extremity/Trunk Assessment   Upper Extremity Assessment Upper Extremity  Assessment: RUE deficits/detail RUE Deficits / Details: general weakness and incoordination RUE Sensation: decreased proprioception (mild)    Lower Extremity Assessment Lower Extremity Assessment: Overall WFL for tasks assessed (mild incoordination R LE)    Cervical / Trunk Assessment Cervical / Trunk Assessment: Normal  Communication   Communication: Expressive difficulties (intelligible if pt speaks slower.)  Cognition Arousal/Alertness: Awake/alert Behavior During Therapy: WFL for tasks assessed/performed Overall Cognitive Status: History of cognitive impairments - at baseline                                          General Comments General comments (skin integrity, edema, etc.): pt and family participated in discussion of determining pt's risk factors and Signs of stroke (BE FAST).    Exercises     Assessment/Plan    PT Assessment Patient needs continued PT services  PT Problem List Decreased strength;Decreased activity tolerance;Decreased balance;Decreased mobility       PT Treatment Interventions      PT Goals (Current goals can be found in the Care Plan section)  Acute Rehab PT Goals Patient Stated Goal: back to work as soon as possible PT Goal Formulation: With patient Time For Goal Achievement: 09/20/22 Potential to Achieve Goals: Good    Frequency Min 3X/week     Co-evaluation               AM-PAC PT "6 Clicks" Mobility  Outcome Measure Help needed turning from your back to your side while in a flat bed without using bedrails?: A Little Help needed moving from lying on your back to sitting on the side of a flat bed without using bedrails?: A Little Help needed moving to and from a bed to a chair (including a wheelchair)?: A Little Help needed standing up from a chair using your arms (e.g., wheelchair or bedside chair)?: A Little Help needed to walk in hospital room?: A Little Help needed climbing 3-5 steps with a railing? :  None 6 Click Score: 19    End of Session   Activity Tolerance: Patient tolerated treatment well Patient left: in bed;with call bell/phone within reach Nurse Communication: Mobility status PT Visit Diagnosis: Other abnormalities of gait and mobility (R26.89);Other symptoms and signs involving the nervous system (R29.898)    Time: 6967-8938 PT Time Calculation (min) (ACUTE ONLY): 28 min   Charges:   PT Evaluation $PT Eval Low Complexity: 1 Low PT Treatments $Gait Training: 8-22 mins        09/06/2022  Jacinto Halim., PT Acute Rehabilitation Services [phone]  (office)  Eliseo Gum Jemina Scahill 09/06/2022, 4:34 PM

## 2022-09-06 NOTE — Evaluation (Signed)
Speech Language Pathology Evaluation Patient Details Name: Eddie Evans MRN: [id number] DOB: 03-Sep-1996 Today's Date: 09/06/2022 Time: 1224-8250 SLP Time Calculation (min) (ACUTE ONLY): 20 min  Problem List:  Patient Active Problem List   Diagnosis Date Noted   ICH (intracerebral hemorrhage) (HCC) 09/05/2022   Anxiety disorder 04/18/2015   Hypertension 04/18/2015   Learning difficulty 09/15/2009   Mental or behavioral problem 09/15/2009   Past Medical History:  Past Medical History:  Diagnosis Date   Anxiety    Hypertension    Past Surgical History: History reviewed. No pertinent surgical history. HPI:  Pt is a 26 yo male presenting 12/20 with R hemiparesis and difficulty speaking. He was found to have a small ICH in the L corona radiata. PMH includes uncontrolled HTN, tobacco and EtOH abuse   Assessment / Plan / Recommendation Clinical Impression  Pt presents with dysarthria, reportedly improved from previous date but still with imprecise articulation and reduced intelligibility. Pt becomes unintelligible to unfamiliar listener when he starts to speak more rapidly and words become very strung together. When his pacing is slower, he is more easily understood. Pt reports that at baseline he has cognitive challenges, including memory in particular. He describes being in a "special" or "different" classroom in school (not finishing high school), which has also led to needing some extra help at his work (memory trouble, difficulty understanding all of the materials). His significant other manages his medications and their finances. He believes that he is at his cognitive baseline, and in more generalized functional tasks, he seems to perform fairly well. Recommend that he does receive SLP services at next level of care at least for his speech, with increased supervision at least upon initial return home.    SLP Assessment  SLP Recommendation/Assessment: All further Speech Lanaguage  Pathology  needs can be addressed in the next venue of care SLP Visit Diagnosis: Dysarthria and anarthria (R47.1)    Recommendations for follow up therapy are one component of a multi-disciplinary discharge planning process, led by the attending physician.  Recommendations may be updated based on patient status, additional functional criteria and insurance authorization.    Follow Up Recommendations  Other (comment) (SLP at next level of care)    Assistance Recommended at Discharge  Intermittent Supervision/Assistance  Functional Status Assessment Patient has had a recent decline in their functional status and demonstrates the ability to make significant improvements in function in a reasonable and predictable amount of time.  Frequency and Duration           SLP Evaluation Cognition  Overall Cognitive Status: History of cognitive impairments - at baseline       Comprehension  Auditory Comprehension Overall Auditory Comprehension: Impaired at baseline    Expression Expression Primary Mode of Expression: Verbal Verbal Expression Overall Verbal Expression: Appears within functional limits for tasks assessed   Oral / Motor  Oral Motor/Sensory Function Overall Oral Motor/Sensory Function: Moderate impairment Facial ROM: Reduced right;Suspected CN VII (facial) dysfunction Facial Symmetry: Abnormal symmetry right;Suspected CN VII (facial) dysfunction Facial Strength: Reduced right;Suspected CN VII (facial) dysfunction Lingual ROM: Within Functional Limits Lingual Symmetry: Within Functional Limits Lingual Strength: Within Functional Limits Velum: Within Functional Limits Mandible: Within Functional Limits Motor Speech Overall Motor Speech: Impaired Respiration: Within functional limits Phonation: Normal Resonance: Within functional limits Articulation: Impaired Level of Impairment: Phrase Intelligibility: Intelligibility reduced Word: 75-100% accurate Phrase: 50-74%  accurate Motor Speech Errors: Not applicable Effective Techniques: Slow rate  Osie Bond., M.A. [place] Office [phone]  Secure chat preferred  09/06/2022, 12:52 PM

## 2022-09-06 NOTE — Evaluation (Signed)
Occupational Therapy Evaluation Patient Details Name: Eddie Evans MRN: [id number] DOB: 02-Apr-1996 Today's Date: 09/06/2022   History of Present Illness 26 y.o. male with hx uncontrolled HTN who presented at approx 0700 this AM to [facility] ED with right hemiparesis, dysarthria, minimal aphasia and was found to have a small acute intracerebral hemorrhage in the left corona radiata.   Clinical Impression   PTA pt lives independently with his girlfriend and his 1 & 2 yo children (His 63 yo does not live with him.)  and works in [employer]. Pt presents with decreased functional use of of dominant RUE ,as noted below, which is very distressing to him, given his job demands. Began education on a HEP to address RUE deficits. Recommend Eddie Needle follow up with outpt OT  - he prefers OT at [facility]. Will follow up in am to provide written HEP. VSS     Recommendations for follow up therapy are one component of a multi-disciplinary discharge planning process, led by the attending physician.  Recommendations may be updated based on patient status, additional functional criteria and insurance authorization.   Follow Up Recommendations  Outpatient OT (neuro outpt)     Assistance Recommended at Discharge Intermittent Supervision/Assistance  Patient can return home with the following Direct supervision/assist for financial management;Direct supervision/assist for medications management    Functional Status Assessment  Patient has had a recent decline in their functional status and demonstrates the ability to make significant improvements in function in a reasonable and predictable amount of time.  Equipment Recommendations  None recommended by OT    Recommendations for Other Services       Precautions / Restrictions Precautions Precautions: None      Mobility Bed Mobility Overal bed mobility: Independent                  Transfers Overall transfer level: Modified independent                         Balance Overall balance assessment: No apparent balance deficits (not formally assessed)                                         ADL either performed or assessed with clinical judgement   ADL Overall ADL's : Needs assistance/impaired                                     Functional mobility during ADLs: Modified independent General ADL Comments: completing bathing/dressing wiht increased time using R hand to assist     Vision   Vision Assessment?: Yes Eye Alignment: Within Functional Limits Ocular Range of Motion: Within Functional Limits Alignment/Gaze Preference: Within Defined Limits Tracking/Visual Pursuits: Able to track stimulus in all quads without difficulty Saccades: Within functional limits Convergence: Within functional limits Visual Fields: No apparent deficits Additional Comments: able to read close up and distant words; States at baseline     Perception     Praxis      Pertinent Vitals/Pain Pain Assessment Pain Assessment: No/denies pain     Hand Dominance Right   Extremity/Trunk Assessment Upper Extremity Assessment Upper Extremity Assessment: RUE deficits/detail RUE Deficits / Details: isolated movements; generalized weakness, stronger proximally; shoulder, elbow wrist overall ROM WFL however generally weak and difficulty isolating movemetn patterns- abnormal shoulder  girdle strength and movement with decreased strength wiht elevation, depression and retraction. difficulty with comleting composite digit and wrist extension; gross grasp/release; however unable to oppose tips of digits; absent in-hand manipulation skills; appeasr unaware of posiiton of hand at times, i.e. trying to push up from bed with wrist in flexed position  apparent sensory motor deficits; able to use as gross assist RUE Sensation: decreased proprioception (mild)   Lower Extremity Assessment Lower Extremity Assessment: Defer to PT  evaluation   Cervical / Trunk Assessment Cervical / Trunk Assessment: Normal   Communication Communication Communication: Expressive difficulties (dysarthric)   Cognition Arousal/Alertness: Awake/alert Behavior During Therapy: WFL for tasks assessed/performed Overall Cognitive Status: History of cognitive impairments - at baseline                                       General Comments       Exercises Exercises: Other exercises Other Exercises Other Exercises: theraputty - pinch and grip strengthening Other Exercises: squeezing and rolling red tubing to work on flex/alternating with extension Other Exercises: cup stacking to work on controlled grasp/release adn coordination Other Exercises: lap slides   Shoulder Instructions      Home Living Family/patient expects to be discharged to:: Private residence Living Arrangements: Spouse/significant other Available Help at Discharge: Family;Available 24 hours/day Type of Home: Apartment Home Access: Stairs to enter [employer] of Steps: 3 Entrance Stairs-Rails: Left;Right Home Layout: One level     Bathroom Shower/Tub: Producer, television/film/video: Standard Bathroom Accessibility: Yes How Accessible: Accessible via walker Home Equipment: None          Prior Functioning/Environment Prior Level of Function : Independent/Modified Independent;Driving;Working/employed               ADLs Comments: girlfriend supervises finances/medication; works in [employer]        OT Problem List: Decreased strength;Decreased range of motion;Decreased coordination;Impaired tone;Impaired sensation;Impaired UE functional use      OT Treatment/Interventions: Self-care/ADL training;Therapeutic exercise;Neuromuscular education;Therapeutic activities;Patient/family education    OT Goals(Current goals can be found in the care plan section) Acute Rehab OT Goals Patient Stated Goal: to get the use back in his arm  so he can wrok OT Goal Formulation: With patient Time For Goal Achievement: 09/20/22 Potential to Achieve Goals: Good  OT Frequency: Min 3X/week    Co-evaluation              AM-PAC OT "6 Clicks" Daily Activity     Outcome Measure Help from another person eating meals?: None Help from another person taking care of personal grooming?: None Help from another person toileting, which includes using toliet, bedpan, or urinal?: None Help from another person bathing (including washing, rinsing, drying)?: A Little Help from another person to put on and taking off regular upper body clothing?: A Little Help from another person to put on and taking off regular lower body clothing?: A Little 6 Click Score: 21   End of Session Nurse Communication: Mobility status;Other (comment) (DC needs)  Activity Tolerance: Patient tolerated treatment well Patient left: in bed;with call bell/phone within reach;with family/visitor present  OT Visit Diagnosis: Muscle weakness (generalized) (M62.81)                Time: 0109-3235 OT Time Calculation (min): 25 min Charges:  OT General Charges $OT Visit: 1 Visit OT Evaluation $OT Eval Moderate Complexity: 1 Mod OT Treatments $Therapeutic  Activity: 8-22 mins  Luisa Dago, OT/L   Acute OT Clinical Specialist Acute Rehabilitation Services Pager [phone] Office [phone]   [facility] 09/06/2022, 4:32 PM

## 2022-09-06 NOTE — Plan of Care (Addendum)
Spoke to patient regarding his depression after he disclosed it was the reason he was not taking his medications.  The patient endorses pervasive sadness, anhedonia, and poor sleep over the past 2 weeks. He denies any other depressive symptoms, stating, "I don't know" to questions regarding appetite, weight loss, energy, concentration, or observed psychomotor activity. He denies suicidal ideation. Patient also cannot recall these symptoms during past depressive episodes, though did note past suicidal ideation. He endorses multiple acute and chronic psychosocial stressors that contribute to his depression. He attributes his girlfriend and children as protective factors to suicide.  When discussing the possibility of psychotropic medications, patients states he does not really like taking medicines because he had a friend who overdosed. I explained to patient that these would be different kinds of medications, though he continues to be hesitant to starting psychotropics. He is more open to trying out therapy which he states his brother has encouraged him to do in the past. Regarding obtaining treatment, patient states, "I have a lot going on in life right now to deal with. I have to provide for my kids."  Additionally, patient endorses unspecified anxiety as well as past trauma he and his brother endured which he attributes to family dynamics. He also endorses having "anger management" problems.  Our conversation was cut short due to arrival of family. I informed patient I will refer him to outpatient psychiatry / therapy and he may reach out to them once he is ready to address his depression, anxiety, and trauma. Patient was amenable to this plan.  Overall, patient does not meet criteria for MDD or any other psychiatric diagnoses at this time given his poor insight into his mood and the limitations of a full evaluation in the hospital setting right after a serious cardiovascular event with family members  coming and going. Furthermore, he is likely in the pre-contemplative stage of change and thus may not identify a potential mood disorder as necessitating intervention at this time - this is coupled by his aversion to medications. He will likely benefit from outpatient psychiatric / therapy evaluation and follow-up once ready.  Augusto Gamble, MD [facility] Psychiatry Intern (PGY-1) 09/06/2022 2:00 PM

## 2022-09-06 NOTE — Progress Notes (Addendum)
STROKE TEAM PROGRESS NOTE   INTERVAL HISTORY Patient evaluated at bedside. He has slurred speech, notable R sided focal neurologic deficits. Patient tells [place] he has not been taking his antihypertensive medication due to depression. See separate plan of care note for depression MRI scan of the brain shows a small left basal ganglia hemorrhage with mild cytotoxic edema but no intraventricular extension or hydrocephalus.  CT angiogram shows no large vessel stenosis or occlusion.  Echocardiogram shows ejection fraction 75%.  Urine drug screen is positive for cannabis. Vitals:   09/06/22 0545 09/06/22 0600 09/06/22 0800 09/06/22 0838  BP: 133/74 136/81  (!) 133/97  Pulse: 63 69 74 80  Resp: 13 14 18 17   Temp:    98.2 F (36.8 C)  TempSrc:    Oral  SpO2: 98% 97% 97% 97%  Weight:      Height:       CBC:  Recent Labs  Lab 09/05/22 0731  WBC 8.2  NEUTROABS 5.0  HGB 15.5  HCT 46.4  MCV 86.9  PLT 293   Basic Metabolic Panel:  Recent Labs  Lab 09/05/22 0731  NA 137  K 3.1*  CL 103  CO2 23  GLUCOSE 99  BUN 14  CREATININE 0.83  CALCIUM 9.1   Lipid Panel:  Recent Labs  Lab 09/06/22 0450  CHOL 155  TRIG 134  HDL 48  CHOLHDL 3.2  VLDL 27  LDLCALC 80   HgbA1c: No results for input(s): "HGBA1C" in the last 168 hours. Urine Drug Screen:  Recent Labs  Lab 09/05/22 0839  LABOPIA NONE DETECTED  COCAINSCRNUR NONE DETECTED  LABBENZ NONE DETECTED  AMPHETMU NONE DETECTED  THCU POSITIVE*  LABBARB NONE DETECTED    Alcohol Level  Recent Labs  Lab 09/05/22 0841  ETH <10    IMAGING past 24 hours ECHOCARDIOGRAM COMPLETE  Result Date: 09/05/2022    ECHOCARDIOGRAM REPORT   Patient Name:   Eddie Evans Date of Exam: 09/05/2022 Medical Rec #:  09/07/2022       Height:       65.0 in Accession #:    [id number]      Weight:       166.7 lb Date of Birth:  1995-10-24      BSA:          1.831 m Patient Age:    26 years        BP:           135/76 mmHg Patient Gender: M                HR:           101 bpm. Exam Location:  Inpatient Procedure: 2D Echo, Cardiac Doppler and Color Doppler Indications:    Stroke  History:        Patient has prior history of Echocardiogram examinations. Risk                 Factors:ETOH and Hypertension. THC.  Sonographer:    07/09/1997 Referring Phys: Milda Smart [facility]  Sonographer Comments: Suboptimal parasternal window and suboptimal apical window. Image acquisition challenging due to uncooperative patient, Image acquisition challenging due to patient body habitus and Image acquisition challenging due to respiratory motion. IMPRESSIONS  1. Left ventricular ejection fraction, by estimation, is >75%. The left ventricle has hyperdynamic function. The left ventricle has no regional wall motion abnormalities. Left ventricular diastolic parameters are consistent with Grade I diastolic dysfunction (impaired relaxation).  2. Right ventricular  systolic function is low normal. The right ventricular size is normal.  3. The mitral valve is grossly normal. Trivial mitral valve regurgitation.  4. The aortic valve is tricuspid. Aortic valve regurgitation is not visualized.  5. The inferior vena cava is normal in size with greater than 50% respiratory variability, suggesting right atrial pressure of 3 mmHg. Comparison(s): No prior Echocardiogram. FINDINGS  Left Ventricle: Left ventricular ejection fraction, by estimation, is >75%. The left ventricle has hyperdynamic function. The left ventricle has no regional wall motion abnormalities. The left ventricular internal cavity size was normal in size. There is no left ventricular hypertrophy. Left ventricular diastolic parameters are consistent with Grade I diastolic dysfunction (impaired relaxation). Indeterminate filling pressures. Right Ventricle: The right ventricular size is normal. No increase in right ventricular wall thickness. Right ventricular systolic function is low normal. Left Atrium: Left atrial size was  normal in size. Right Atrium: Right atrial size was normal in size. Pericardium: There is no evidence of pericardial effusion. Mitral Valve: The mitral valve is grossly normal. Trivial mitral valve regurgitation. Tricuspid Valve: The tricuspid valve is normal in structure. Tricuspid valve regurgitation is not demonstrated. Aortic Valve: The aortic valve is tricuspid. Aortic valve regurgitation is not visualized. Pulmonic Valve: The pulmonic valve was normal in structure. Pulmonic valve regurgitation is not visualized. Aorta: The aortic root and ascending aorta are structurally normal, with no evidence of dilitation. Venous: The inferior vena cava is normal in size with greater than 50% respiratory variability, suggesting right atrial pressure of 3 mmHg. IAS/Shunts: No atrial level shunt detected by color flow Doppler.  LEFT VENTRICLE PLAX 2D LVIDd:         3.90 cm   Diastology LVIDs:         2.50 cm   LV e' medial:  8.92 cm/s LV PW:         1.10 cm   LV e' lateral: 11.70 cm/s LV IVS:        0.70 cm LVOT diam:     2.00 cm LV SV:         88 LV SV Index:   48 LVOT Area:     3.14 cm  RIGHT VENTRICLE             IVC RV S prime:     10.70 cm/s  IVC diam: 1.30 cm LEFT ATRIUM           Index        RIGHT ATRIUM          Index LA diam:      2.30 cm 1.26 cm/m   RA Area:     9.16 cm LA Vol (A2C): 16.3 ml 8.90 ml/m   RA Volume:   18.70 ml 10.21 ml/m LA Vol (A4C): 24.2 ml 13.22 ml/m  AORTIC VALVE LVOT Vmax:   156.00 cm/s LVOT Vmean:  108.000 cm/s LVOT VTI:    0.280 m  AORTA Ao Root diam: 2.70 cm Ao Asc diam:  2.20 cm  SHUNTS Systemic VTI:  0.28 m Systemic Diam: 2.00 cm Zoila Shutter MD Electronically signed by Zoila Shutter MD Signature Date/Time: 09/05/2022/7:19:22 PM    Final    MR BRAIN W WO CONTRAST  Result Date: 09/05/2022 CLINICAL DATA:  Hemorrhagic stroke.  Uncontrolled hypertension. EXAM: MRI HEAD WITHOUT AND WITH CONTRAST TECHNIQUE: Multiplanar, multiecho pulse sequences of the brain and surrounding  structures were obtained without and with intravenous contrast. CONTRAST:  7.5mL GADAVIST GADOBUTROL 1 MMOL/ML IV SOLN COMPARISON:  Head CT, CTA, and CTP 09/05/2022 FINDINGS: Some sequences are mildly to moderately motion degraded. Brain: A 2 x 1 x 1 cm (volume of 1 mL) hemorrhage involving the left corona radiata and posterior lentiform nucleus is unchanged in size from today's earlier CT. Mild surrounding edema is stable to minimally increased, and there is no associated mass effect. The hemorrhage demonstrates T2 hyperintensity and mixed but mildly hyperintense T1 signal, and there is a 2-3 mm focus of enhancement in the left aspect of the hemorrhage which could be vascular (series 10, image 24). No frank masslike enhancement is present. The brain is normal in signal elsewhere. No acute cortically based infarct, midline shift, or extra-axial fluid collection is evident. The ventricles and sulci are normal. Vascular: Major intracranial vascular flow voids are preserved. Skull and upper cervical spine: Unremarkable bone marrow signal. Sinuses/Orbits: Unremarkable orbits. Paranasal sinuses and mastoid air cells are clear. Other: None. IMPRESSION: 1. Unchanged 2 cm left corona radiata/basal ganglia hemorrhage with mild surrounding edema and no mass effect. Punctate focus of possible vascular enhancement within the hemorrhage. No findings highly suspicious for an underlying mass. 2. Otherwise unremarkable appearance of the brain. Electronically Signed   By: Sebastian AcheAllen  Grady M.D.   On: 09/05/2022 14:39   CT HEAD WO CONTRAST  Result Date: 09/05/2022 CLINICAL DATA:  Stroke, hemorrhagic EXAM: CT HEAD WITHOUT CONTRAST TECHNIQUE: Contiguous axial images were obtained from the base of the skull through the vertex without intravenous contrast. RADIATION DOSE REDUCTION: This exam was performed according to the departmental dose-optimization program which includes automated exposure control, adjustment of the mA and/or kV  according to patient size and/or use of iterative reconstruction technique. COMPARISON:  CT head from the same day. FINDINGS: Brain: Unchanged acute hemorrhage in the left corona radiata. No progressive mass effect. No midline shift. No evidence of acute large vascular territory infarct or hydrocephalus. Vascular: No hyperdense vessel identified. Skull: No acute fracture. Sinuses/Orbits: Clear sinuses.  No acute orbital findings. Other: No mastoid effusions IMPRESSION: Unchanged acute hemorrhage in the left corona radiata. No progressive mass effect. Electronically Signed   By: Feliberto HartsFrederick S Jones M.D.   On: 09/05/2022 14:26    PHYSICAL EXAM General: well-appearing and in no acute distress HEENT: normocephalic and atraumatic Cardiovascular: regular rate Respiratory: normal respiratory effort and on RA Gastrointestinal: non-tender and non-distended Extremities: moving all extremities spontaneously  Mental Status: Eddie Evans is alert; he is oriented to person, place, time, and situation. Speech was clear and slurred without evidence of aphasia. He was able to follow 3 step commands without difficulty.  Cranial Nerves: II:  Visual fields grossly normal III,IV, VI: no ptosis, extra-ocular motions intact bilaterally V,VII: smile asymmetric with notable droop on R side, facial light touch sensation intact bilaterally XII: tongue extension deviates to R side without atrophy and without fasciculations  Motor: Right : Upper extremity   4/5 full range of motion against gravity and offers some resistance diminished fine finger movements on the right.  Orbits left or right upper extremity.  Lower extremity   4/5 full range of motion against gravity and offers some resistance  Left: Upper extremity   5/5 full power Lower extremity   5/5 full power  Tone and bulk: normal tone throughout; no atrophy noted  Slowed finger taps on R side  Sensory: sensation to light touch intact throughout  bilaterally  Cerebellar: Finger-to-nose test impaired on R side, heel-to-shin test normal  Gait: not observed during encounter  ASSESSMENT/PLAN Eddie Evans is  a 26 y.o. male with hx uncontrolled HTN who presented at approx 0700 this AM to [facility] ED with right hemiparesis, dysarthria, minimal aphasia and was found to have a small acute intracerebral hemorrhage in the left corona radiata.   Etiology is felt to be uncontrolled hypertension. BP 177/125 on arrival, now controlled at goal SBP<150 on cardene. Exam is unchanged from documented exam at [facility] before transfer.   Denies any substance use, did not take his BP meds all of last week. Smokes, drinks 2 bottles of beers a day, uses recreational marijuana. He is not on blood thinners and did not use cocaine.  #Acute L corona radiata / basal ganglia hemorrhage likely due to uncontrolled hypertension Code Stroke CT head showed acute hemorrhage in the left corona radiata measuring 2 x 1 x 1 cm. Repeat CT head w/o contrast showed unchanged acute hemorrhage in the left corona radiata. No progressive mass effect. CTA head & neck showed no intracranial large vessel occlusion or proximal high-grade arterial stenosis. No abnormal arterial enhancement is demonstrated in the region of the left corona radiata hemorrhage to suggest an underlying vascular lesion. MRI  showed unchanged 2 cm left corona radiata/basal ganglia hemorrhage with mild surrounding edema and no mass effect. Punctate focus of possible vascular enhancement within the hemorrhage. No findings highly suspicious for an underlying mass. Otherwise unremarkable appearance of the brain. 2D Echo EF > 75% LDL 80 HgbA1c pending VTE prophylaxis - SCDs    Diet   Diet Heart Room service appropriate? Yes; Fluid consistency: Thin   No antithrombotic prior to admission, now on No antithrombotic. Therapy recommendations:  pending evaluation Disposition:  pending medical clearance  Hypertensive  emergency, resolved Home meds:  amlodipine-benazepril 5-10 mg Nicardipine stopped, now back on amlodipine-benazepril 5-10 mg Long-term BP goal normotensive  Hyperlipidemia Home meds:  none LDL 80, goal < 70 Consider adding pravastatin 10 mg on discharge High intensity statin not indicated. Start statin at discharge  Depression, unspecified Anxiety, unspecified See separate plan of care note Outpatient follow-up with psychiatry  Hypokalemia K 3.1 yesterday, repleted.  recheck pending  Other Stroke Risk Factors Cigarette smoker. Advised to stop smoking ETOH use, alcohol level <10, advised to reduce alcohol intake Substance abuse - UDS:  THC POSITIVE, Cocaine NONE DETECTED. Patient advised to stop using due to stroke risk.  Hospital day # 1  I have personally obtained history,examined this patient, reviewed notes, independently viewed imaging studies, participated in medical decision making and plan of care.ROS completed by me personally and pertinent positives fully documented  I have made any additions or clarifications directly to the above note. Agree with note above.  Patient presented with right hemiparesis due to small left basal ganglia hemorrhage likely from uncontrolled hypertension.  Remains at risk for hematoma expansion neurological worsening and needs close neurological monitoring with strict blood pressure control with systolic goal between 130-150 for the first 24 hours and then below 160.  Start home blood pressure medications.  Mobilize out of bed.  Therapy consults.  Patient counseled to be compliant with taking her blood pressure medications and medical follow-up.  He was counseled to quit using marijuana.This patient is critically ill and at significant risk of neurological worsening, death and care requires constant monitoring of vital signs, hemodynamics,respiratory and cardiac monitoring, extensive review of multiple databases, frequent neurological assessment,  discussion with family, other specialists and medical decision making of high complexity.I have made any additions or clarifications directly to the above note.This critical care time does  not reflect procedure time, or teaching time or supervisory time of PA/NP/Med Resident etc but could involve care discussion time.  I spent 30 minutes of neurocritical care time  in the care of  this patient.      Delia Heady, MD Medical Director [facility] Pager: [phone] 09/06/2022 5:38 PM  To contact Stroke Continuity provider, please refer to [email or web address]. After hours, contact General Neurology

## 2022-09-06 NOTE — Evaluation (Signed)
Clinical/Bedside Swallow Evaluation Patient Details  Name: Eddie Evans MRN: [id number] Date of Birth: Jan 23, 1996  Today's Date: 09/06/2022 Time: SLP Start Time (ACUTE ONLY): 1051 SLP Stop Time (ACUTE ONLY): 1105 SLP Time Calculation (min) (ACUTE ONLY): 14 min  Past Medical History:  Past Medical History:  Diagnosis Date   Anxiety    Hypertension    Past Surgical History: History reviewed. No pertinent surgical history. HPI:  Pt is a 26 yo male presenting 12/20 with R hemiparesis and difficulty speaking. He was found to have a small ICH in the L corona radiata. PMH includes uncontrolled HTN, tobacco and EtOH abuse    Assessment / Plan / Recommendation  Clinical Impression  Pt has symptoms of a mild oral dysphagia as a result of R sided facial weakness (CN VII). Functionally, this is observed to impact management of saliva primarily, with minimal amounts of anterior loss noted. Pt is also very aware of this, and tries to wipe it from his mouth. He has improved oral containment anteriorly when drinking via straw and no overt loss or residue is noted with solids that include both regular textures as well as pills offered by RN. Recommend that he continue with regular solids and thin liquids. Encouraged him to monitor for loss and to try to work on labial seal as much as possible within functional context. SLP Visit Diagnosis: Dysphagia, oral phase (R13.11)    Aspiration Risk  Mild aspiration risk    Diet Recommendation Regular;Thin liquid   Liquid Administration via: Cup;Straw Medication Administration: Whole meds with liquid Supervision: Patient able to self feed;Intermittent supervision to cue for compensatory strategies;Comment (set up as needed) Compensations: Slow rate;Small sips/bites;Monitor for anterior loss Postural Changes: Seated upright at 90 degrees    Other  Recommendations Oral Care Recommendations: Oral care BID    Recommendations for follow up therapy are one  component of a multi-disciplinary discharge planning process, led by the attending physician.  Recommendations may be updated based on patient status, additional functional criteria and insurance authorization.  Follow up Recommendations Other (comment) (SLP at next level of care)      Assistance Recommended at Discharge    Functional Status Assessment Patient has had a recent decline in their functional status and demonstrates the ability to make significant improvements in function in a reasonable and predictable amount of time.  Frequency and Duration            Prognosis        Swallow Study   General HPI: Pt is a 26 yo male presenting 12/20 with R hemiparesis and difficulty speaking. He was found to have a small ICH in the L corona radiata. PMH includes uncontrolled HTN, tobacco and EtOH abuse Type of Study: Bedside Swallow Evaluation Previous Swallow Assessment: none in chart Diet Prior to this Study: Regular;Thin liquids Temperature Spikes Noted: No Respiratory Status: Room air History of Recent Intubation: No Behavior/Cognition: Alert;Cooperative Oral Cavity Assessment: Within Functional Limits Oral Care Completed by SLP: No Oral Cavity - Dentition: Adequate natural dentition Vision: Functional for self-feeding Self-Feeding Abilities: Able to feed self Patient Positioning: Upright in bed Baseline Vocal Quality: Normal Volitional Cough: Strong Volitional Swallow: Able to elicit    Oral/Motor/Sensory Function Overall Oral Motor/Sensory Function: Moderate impairment Facial ROM: Reduced right;Suspected CN VII (facial) dysfunction Facial Symmetry: Abnormal symmetry right;Suspected CN VII (facial) dysfunction Facial Strength: Reduced right;Suspected CN VII (facial) dysfunction Lingual ROM: Within Functional Limits Lingual Symmetry: Within Functional Limits Lingual Strength: Within Functional Limits Velum: Within  Functional Limits Mandible: Within Functional Limits   Ice  Chips Ice chips: Not tested   Thin Liquid Thin Liquid: Impaired Presentation: Self Fed;Straw Oral Phase Impairments: Reduced labial seal Oral Phase Functional Implications: Right anterior spillage    Nectar Thick Nectar Thick Liquid: Not tested   Honey Thick Honey Thick Liquid: Not tested   Puree Puree: Not tested (offered, but pt did not eat)   Solid     Solid: Within functional limits Presentation: Self Fed      Mahala Menghini., M.A. CCC-SLP Acute Rehabilitation Services Office [phone]  Secure chat preferred  09/06/2022,12:21 PM

## 2022-09-06 NOTE — Progress Notes (Signed)
Pt transferred from 4N28 to 3W09. Pt's significant other at bedside. Report called to RN on 3W. All belongings packed and sent with pt. Pt and s/o have no questions or concerns at this time.  Robina Ade, RN

## 2022-09-06 NOTE — Progress Notes (Signed)
OT Cancellation Note  Patient Details Name: Eddie Evans MRN: [id number] DOB: 1995-10-10   Cancelled Treatment:    Reason Eval/Treat Not Completed: Active bedrest order (Will assess when activity orders updated. Thanks)  Jannifer Hick 09/06/2022, 7:40 AM Luisa Dago, OT/L   Acute OT Clinical Specialist Acute Rehabilitation Services Pager [phone] Office [phone]

## 2022-09-07 ENCOUNTER — Encounter (HOSPITAL_COMMUNITY): Payer: Self-pay | Admitting: Neurology

## 2022-09-07 DIAGNOSIS — I610 Nontraumatic intracerebral hemorrhage in hemisphere, subcortical: Secondary | ICD-10-CM | POA: Diagnosis not present

## 2022-09-07 LAB — BASIC METABOLIC PANEL
Anion gap: 10 (ref 5–15)
BUN: 12 mg/dL (ref 6–20)
CO2: 23 mmol/L (ref 22–32)
Calcium: 9.9 mg/dL (ref 8.9–10.3)
Chloride: 104 mmol/L (ref 98–111)
Creatinine, Ser: 0.84 mg/dL (ref 0.61–1.24)
GFR, Estimated: >60 mL/min (ref >60)
Glucose, Bld: 101 mg/dL — ABNORMAL HIGH (ref 70–99)
Potassium: 3.8 mmol/L (ref 3.5–5.1)
Sodium: 137 mmol/L (ref 135–145)

## 2022-09-07 LAB — HEMOGLOBIN A1C
Hgb A1c MFr Bld: 5.4 % (ref 4.8–5.6)
Mean Plasma Glucose: 108 mg/dL

## 2022-09-07 MED ORDER — NICOTINE POLACRILEX 2 MG MT GUM: 2.0000 mg | CHEWING_GUM | OROMUCOSAL | 0 refills | Status: DC | PRN

## 2022-09-07 MED ORDER — BENAZEPRIL HCL 10 MG PO TABS: 10.0000 mg | ORAL_TABLET | Freq: Every day | ORAL | 1 refills | Status: DC

## 2022-09-07 MED ORDER — NICOTINE 21 MG/24HR TD PT24: 21.0000 mg | MEDICATED_PATCH | Freq: Every day | TRANSDERMAL | 0 refills | Status: DC

## 2022-09-07 MED ORDER — AMLODIPINE BESYLATE 5 MG PO TABS: 5.0000 mg | ORAL_TABLET | Freq: Every day | ORAL | 1 refills | Status: DC

## 2022-09-07 NOTE — TOC Progression Note (Signed)
Transition of Care [facility]) - Progression Note    Patient Details  Name: Eddie Evans MRN: [id number] Date of Birth: 1996-03-10  Transition of Care [facility]) CM/SW Contact  Tory Emerald, [place] Phone Number: 09/07/2022, 11:49 AM  Clinical Narrative:     CSW completed CAGE assessment and provided SU resources.       Expected Discharge Plan and Services                                               Social Determinants of Health (SDOH) Interventions SDOH Screenings   Food Insecurity: No Food Insecurity (09/06/2022)  Housing: Low Risk  (09/06/2022)  Transportation Needs: Unmet Transportation Needs (09/06/2022)  Utilities: Not At Risk (09/06/2022)  Depression (PHQ2-9): Low Risk  (10/14/2020)  Tobacco Use: High Risk (09/07/2022)    Readmission Risk Interventions     No data to display

## 2022-09-07 NOTE — TOC Transition Note (Signed)
Transition of Care [facility]) - CM/SW Discharge Note   Patient Details  Name: Eddie Evans MRN: [id number] Date of Birth: 09-08-1996  [facility] CM/SW Contact:  Lockie Pares, RN Phone Number: 09/07/2022, 12:58 PM   Clinical Narrative:      PT changed referral from home health to OP rehab. [facility] neuro- sent referral. Placed on AVS.  No further needs identified       Patient Goals and CMS Choice      Discharge Placement                         Discharge Plan and Services Additional resources added to the After Visit Summary for                                       Social Determinants of Health (SDOH) Interventions SDOH Screenings   Food Insecurity: No Food Insecurity (09/06/2022)  Housing: Low Risk  (09/06/2022)  Transportation Needs: Unmet Transportation Needs (09/06/2022)  Utilities: Not At Risk (09/06/2022)  Depression (PHQ2-9): Low Risk  (10/14/2020)  Tobacco Use: High Risk (09/07/2022)     Readmission Risk Interventions     No data to display

## 2022-09-07 NOTE — Discharge Summary (Addendum)
Stroke Discharge Summary  Patient ID: Eddie Evans   MRN: [id number]      DOB: Jun 03, 1996  Date of Admission: 09/05/2022 Date of Discharge: 09/07/2022  Attending Physician:  Stroke, Md, MD, Stroke MD Consultant(s):    None  Patient's PCP:  Malva Limes, MD  DISCHARGE DIAGNOSIS: Acute L corona radiata / basal ganglia hemorrhage likely due to uncontrolled hypertension  Principal Problem:   ICH (intracerebral hemorrhage) (HCC) Active Problems:   Depression, unspecified Right hemiparesis Right facial weakness  Allergies as of 09/07/2022   No Known Allergies      Medication List     STOP taking these medications    amLODipine-benazepril 5-10 MG capsule Commonly known as: LOTREL       TAKE these medications    amLODipine 5 MG tablet Commonly known as: NORVASC Take 1 tablet (5 mg total) by mouth daily. Start taking on: September 08, 2022   benazepril 10 MG tablet Commonly known as: LOTENSIN Take 1 tablet (10 mg total) by mouth daily. Start taking on: September 08, 2022   nicotine 21 mg/24hr patch Commonly known as: NICODERM CQ - dosed in mg/24 hours Place 1 patch (21 mg total) onto the skin daily for 14 days. Start taking on: September 23, 2023   nicotine polacrilex 2 MG gum Commonly known as: NICORETTE Take 1 each (2 mg total) by mouth as needed for smoking cessation.        LABORATORY STUDIES CBC    Component Value Date/Time   WBC 8.2 09/05/2022 0731   RBC 5.34 09/05/2022 0731   HGB 15.5 09/05/2022 0731   HCT 46.4 09/05/2022 0731   PLT 293 09/05/2022 0731   MCV 86.9 09/05/2022 0731   MCH 29.0 09/05/2022 0731   MCHC 33.4 09/05/2022 0731   RDW 12.8 09/05/2022 0731   LYMPHSABS 2.5 09/05/2022 0731   MONOABS 0.5 09/05/2022 0731   EOSABS 0.1 09/05/2022 0731   BASOSABS 0.1 09/05/2022 0731   CMP    Component Value Date/Time   NA 137 09/07/2022 0332   NA 138 08/16/2020 1636   K 3.8 09/07/2022 0332   CL 104 09/07/2022 0332   CO2 23  09/07/2022 0332   GLUCOSE 101 (H) 09/07/2022 0332   BUN 12 09/07/2022 0332   BUN 14 08/16/2020 1636   CREATININE 0.84 09/07/2022 0332   CALCIUM 9.9 09/07/2022 0332   PROT 7.5 09/05/2022 0731   ALBUMIN 4.0 09/05/2022 0731   ALBUMIN 4.7 08/16/2020 1636   AST 31 09/05/2022 0731   ALT 27 09/05/2022 0731   ALKPHOS 85 09/05/2022 0731   BILITOT 0.8 09/05/2022 0731   GFRNONAA >60 09/07/2022 0332   GFRAA 149 08/16/2020 1636   COAGS Lab Results  Component Value Date   INR 1.1 09/05/2022   Lipid Panel    Component Value Date/Time   CHOL 155 09/06/2022 0450   CHOL 163 08/16/2020 1636   TRIG 134 09/06/2022 0450   HDL 48 09/06/2022 0450   HDL 57 08/16/2020 1636   CHOLHDL 3.2 09/06/2022 0450   VLDL 27 09/06/2022 0450   LDLCALC 80 09/06/2022 0450   LDLCALC 75 08/16/2020 1636   HgbA1C  Lab Results  Component Value Date   HGBA1C 5.4 09/06/2022   Urinalysis    Component Value Date/Time   COLORURINE STRAW (A) 09/05/2022 0839   APPEARANCEUR CLEAR (A) 09/05/2022 0839   LABSPEC 1.031 (H) 09/05/2022 0839   PHURINE 7.0 09/05/2022 0839   GLUCOSEU NEGATIVE 09/05/2022 8416  HGBUR NEGATIVE 09/05/2022 0839   BILIRUBINUR NEGATIVE 09/05/2022 0839   KETONESUR NEGATIVE 09/05/2022 0839   PROTEINUR NEGATIVE 09/05/2022 0839   NITRITE NEGATIVE 09/05/2022 0839   LEUKOCYTESUR NEGATIVE 09/05/2022 0839   Urine Drug Screen     Component Value Date/Time   LABOPIA NONE DETECTED 09/05/2022 0839   COCAINSCRNUR NONE DETECTED 09/05/2022 0839   LABBENZ NONE DETECTED 09/05/2022 0839   AMPHETMU NONE DETECTED 09/05/2022 0839   THCU POSITIVE (A) 09/05/2022 0839   LABBARB NONE DETECTED 09/05/2022 0839    Alcohol Level    Component Value Date/Time   ETH <10 09/05/2022 0841     SIGNIFICANT DIAGNOSTIC STUDIES ECHOCARDIOGRAM COMPLETE  Result Date: 09/05/2022    ECHOCARDIOGRAM REPORT   Patient Name:   Eddie Evans Date of Exam: 09/05/2022 Medical Rec #:  [id number]       Height:       65.0 in  Accession #:    [id number]      Weight:       166.7 lb Date of Birth:  [birth year]      BSA:          1.831 m Patient Age:    26 years        BP:           135/76 mmHg Patient Gender: M               HR:           101 bpm. Exam Location:  Inpatient Procedure: 2D Echo, Cardiac Doppler and Color Doppler Indications:    Stroke  History:        Patient has prior history of Echocardiogram examinations. Risk                 Factors:ETOH and Hypertension. THC.  Sonographer:    Milda Smart Referring Phys: [id number] [facility]  Sonographer Comments: Suboptimal parasternal window and suboptimal apical window. Image acquisition challenging due to uncooperative patient, Image acquisition challenging due to patient body habitus and Image acquisition challenging due to respiratory motion. IMPRESSIONS  1. Left ventricular ejection fraction, by estimation, is >75%. The left ventricle has hyperdynamic function. The left ventricle has no regional wall motion abnormalities. Left ventricular diastolic parameters are consistent with Grade I diastolic dysfunction (impaired relaxation).  2. Right ventricular systolic function is low normal. The right ventricular size is normal.  3. The mitral valve is grossly normal. Trivial mitral valve regurgitation.  4. The aortic valve is tricuspid. Aortic valve regurgitation is not visualized.  5. The inferior vena cava is normal in size with greater than 50% respiratory variability, suggesting right atrial pressure of 3 mmHg. Comparison(s): No prior Echocardiogram. FINDINGS  Left Ventricle: Left ventricular ejection fraction, by estimation, is >75%. The left ventricle has hyperdynamic function. The left ventricle has no regional wall motion abnormalities. The left ventricular internal cavity size was normal in size. There is no left ventricular hypertrophy. Left ventricular diastolic parameters are consistent with Grade I diastolic dysfunction (impaired relaxation). Indeterminate filling  pressures. Right Ventricle: The right ventricular size is normal. No increase in right ventricular wall thickness. Right ventricular systolic function is low normal. Left Atrium: Left atrial size was normal in size. Right Atrium: Right atrial size was normal in size. Pericardium: There is no evidence of pericardial effusion. Mitral Valve: The mitral valve is grossly normal. Trivial mitral valve regurgitation. Tricuspid Valve: The tricuspid valve is normal in structure. Tricuspid valve regurgitation is not demonstrated. Aortic Valve: The aortic  valve is tricuspid. Aortic valve regurgitation is not visualized. Pulmonic Valve: The pulmonic valve was normal in structure. Pulmonic valve regurgitation is not visualized. Aorta: The aortic root and ascending aorta are structurally normal, with no evidence of dilitation. Venous: The inferior vena cava is normal in size with greater than 50% respiratory variability, suggesting right atrial pressure of 3 mmHg. IAS/Shunts: No atrial level shunt detected by color flow Doppler.  LEFT VENTRICLE PLAX 2D LVIDd:         3.90 cm   Diastology LVIDs:         2.50 cm   LV e' medial:  8.92 cm/s LV PW:         1.10 cm   LV e' lateral: 11.70 cm/s LV IVS:        0.70 cm LVOT diam:     2.00 cm LV SV:         88 LV SV Index:   48 LVOT Area:     3.14 cm  RIGHT VENTRICLE             IVC RV S prime:     10.70 cm/s  IVC diam: 1.30 cm LEFT ATRIUM           Index        RIGHT ATRIUM          Index LA diam:      2.30 cm 1.26 cm/m   RA Area:     9.16 cm LA Vol (A2C): 16.3 ml 8.90 ml/m   RA Volume:   18.70 ml 10.21 ml/m LA Vol (A4C): 24.2 ml 13.22 ml/m  AORTIC VALVE LVOT Vmax:   156.00 cm/s LVOT Vmean:  108.000 cm/s LVOT VTI:    0.280 m  AORTA Ao Root diam: 2.70 cm Ao Asc diam:  2.20 cm  SHUNTS Systemic VTI:  0.28 m Systemic Diam: 2.00 cm Zoila Shutter MD Electronically signed by Zoila Shutter MD Signature Date/Time: 09/05/2022/7:19:22 PM    Final    MR BRAIN W WO CONTRAST  Result Date:  09/05/2022 CLINICAL DATA:  Hemorrhagic stroke.  Uncontrolled hypertension. EXAM: MRI HEAD WITHOUT AND WITH CONTRAST TECHNIQUE: Multiplanar, multiecho pulse sequences of the brain and surrounding structures were obtained without and with intravenous contrast. CONTRAST:  7.98mL GADAVIST GADOBUTROL 1 MMOL/ML IV SOLN COMPARISON:  Head CT, CTA, and CTP 09/05/2022 FINDINGS: Some sequences are mildly to moderately motion degraded. Brain: A 2 x 1 x 1 cm (volume of 1 mL) hemorrhage involving the left corona radiata and posterior lentiform nucleus is unchanged in size from today's earlier CT. Mild surrounding edema is stable to minimally increased, and there is no associated mass effect. The hemorrhage demonstrates T2 hyperintensity and mixed but mildly hyperintense T1 signal, and there is a 2-3 mm focus of enhancement in the left aspect of the hemorrhage which could be vascular (series 10, image 24). No frank masslike enhancement is present. The brain is normal in signal elsewhere. No acute cortically based infarct, midline shift, or extra-axial fluid collection is evident. The ventricles and sulci are normal. Vascular: Major intracranial vascular flow voids are preserved. Skull and upper cervical spine: Unremarkable bone marrow signal. Sinuses/Orbits: Unremarkable orbits. Paranasal sinuses and mastoid air cells are clear. Other: None. IMPRESSION: 1. Unchanged 2 cm left corona radiata/basal ganglia hemorrhage with mild surrounding edema and no mass effect. Punctate focus of possible vascular enhancement within the hemorrhage. No findings highly suspicious for an underlying mass. 2. Otherwise unremarkable appearance of the brain. Electronically Signed  By: Sebastian Ache M.D.   On: 09/05/2022 14:39   CT HEAD WO CONTRAST  Result Date: 09/05/2022 CLINICAL DATA:  Stroke, hemorrhagic EXAM: CT HEAD WITHOUT CONTRAST TECHNIQUE: Contiguous axial images were obtained from the base of the skull through the vertex without  intravenous contrast. RADIATION DOSE REDUCTION: This exam was performed according to the departmental dose-optimization program which includes automated exposure control, adjustment of the mA and/or kV according to patient size and/or use of iterative reconstruction technique. COMPARISON:  CT head from the same day. FINDINGS: Brain: Unchanged acute hemorrhage in the left corona radiata. No progressive mass effect. No midline shift. No evidence of acute large vascular territory infarct or hydrocephalus. Vascular: No hyperdense vessel identified. Skull: No acute fracture. Sinuses/Orbits: Clear sinuses.  No acute orbital findings. Other: No mastoid effusions IMPRESSION: Unchanged acute hemorrhage in the left corona radiata. No progressive mass effect. Electronically Signed   By: Feliberto Harts M.D.   On: 09/05/2022 14:26   CT ANGIO HEAD NECK W WO CM W PERF (CODE STROKE)  Addendum Date: 09/05/2022   ADDENDUM REPORT: 09/05/2022 09:26 ADDENDUM: These results were called by telephone at the time of interpretation on 09/05/2022 at 8:40 am to provider [facility] , who verbally acknowledged these results. Electronically Signed   By: Jackey Loge D.O.   On: 09/05/2022 09:26   Result Date: 09/05/2022 CLINICAL DATA:  Provided history: Stroke, follow-up. EXAM: CT ANGIOGRAPHY HEAD AND NECK CT PERFUSION BRAIN TECHNIQUE: Multidetector CT imaging of the head and neck was performed using the standard protocol during bolus administration of intravenous contrast. Multiplanar CT image reconstructions and MIPs were obtained to evaluate the vascular anatomy. Carotid stenosis measurements (when applicable) are obtained utilizing NASCET criteria, using the distal internal carotid diameter as the denominator. Multiphase CT imaging of the brain was performed following IV bolus contrast injection. Subsequent parametric perfusion maps were calculated using RAPID software. RADIATION DOSE REDUCTION: This exam was performed according  to the departmental dose-optimization program which includes automated exposure control, adjustment of the mA and/or kV according to patient size and/or use of iterative reconstruction technique. CONTRAST:  OMNIPAQUE IOHEXOL 350 MG/ML SOLN COMPARISON:  None Available. Chest head CT performed earlier today 09/05/2022. FINDINGS: CTA NECK FINDINGS Aortic arch: The visualized aortic arch is normal in caliber. Standard aortic branching. Streak and beam hardening artifact arising from a dense right-sided contrast bolus partially obscures the right subclavian artery. Within this limitation, there is no appreciable hemodynamically significant innominate or proximal subclavian artery stenosis. Right carotid system: CCA and ICA patent within the neck without stenosis or evidence of dissection. Left carotid system: CCA and ICA patent within the neck without stenosis or evidence of dissection. Vertebral arteries: Vertebral arteries codominant and patent within the neck without stenosis or evidence of dissection. Skeleton: Levocurvature of the cervical spine. Dextrocurvature of the visualized upper thoracic spine. Nonspecific reversal of the expected cervical lordosis. No acute fracture or aggressive osseous lesion. Other neck: No neck mass or cervical lymphadenopathy. Upper chest: No consolidation within the imaged lung apices. Review of the MIP images confirms the above findings CTA HEAD FINDINGS Anterior circulation: The intracranial internal carotid arteries are patent. The M1 middle cerebral arteries are patent. No M2 proximal branch occlusion or high-grade proximal stenosis. The anterior cerebral arteries are patent. No intracranial aneurysm is identified. Posterior circulation: The intracranial vertebral arteries are patent. The basilar artery is patent. The posterior cerebral arteries are patent. Posterior communicating arteries are hypoplastic or absent. Venous sinuses: Within the  limitations of contrast timing,  no convincing thrombus. Anatomic variants: As described. Other: No abnormal arterial enhancement is demonstrated in the region of the left corona radiata hemorrhage to suggest an underlying vascular lesion. Review of the MIP images confirms the above findings CT Brain Perfusion Findings: CBF (<30%) Volume: 0mL Perfusion (Tmax>6.0s) volume: 0mL Mismatch Volume: 0mL Infarction Location:None identified. Attempts are being made to reach the ordering provider at this time. IMPRESSION: CTA neck: The common carotid, internal carotid and vertebral arteries are patent within the neck without stenosis or evidence of dissection. CTA head: 1. No intracranial large vessel occlusion or proximal high-grade arterial stenosis. 2. No abnormal arterial enhancement is demonstrated in the region of the left corona radiata hemorrhage to suggest an underlying vascular lesion. CT perfusion head: The perfusion software identifies no core infarct. The perfusion software identifies no critically hypoperfused parenchyma utilizing the Tmax>6 seconds threshold. No mismatch volume reported. Electronically Signed: By: Jackey LogeKyle  Golden D.O. On: 09/05/2022 08:33   CT HEAD CODE STROKE WO CONTRAST  Result Date: 09/05/2022 CLINICAL DATA:  Code stroke. Right-sided upper extremity. Facial droop. EXAM: CT HEAD WITHOUT CONTRAST TECHNIQUE: Contiguous axial images were obtained from the base of the skull through the vertex without intravenous contrast. RADIATION DOSE REDUCTION: This exam was performed according to the departmental dose-optimization program which includes automated exposure control, adjustment of the mA and/or kV according to patient size and/or use of iterative reconstruction technique. COMPARISON:  None Available. FINDINGS: Brain: Crescentic shaped acute hemorrhage in the left corona radiata measuring 2 x 1 x 1 cm (1 cc). Thin rim of adjacent edema. Reportedly there is history of marked hypertension off of antihypertensives. No gray matter  infarct, hydrocephalus, or collection. Vascular: No hyperdense vessel or unexpected calcification. Skull: Normal. Negative for fracture or focal lesion. Sinuses/Orbits: No acute finding. Other: Critical Value/emergent results were called by telephone at the time of interpretation on 09/05/2022 at 7:52 am to [facility] , who verbally acknowledged these results. ASPECTS [facility]([place] Stroke Program Early CT Score) Not scored in this setting IMPRESSION: Acute hemorrhage in the left corona radiata measuring 2 x 1 x 1 cm. Electronically Signed   By: Tiburcio PeaJonathan  Watts M.D.   On: 09/05/2022 07:53      HISTORY OF PRESENT ILLNESS Patient with a history of hypertension and smoking presented with right hemiparesis, aphasia and dysarthria.  HOSPITAL COURSE Patient was found to have a small acute ICH in the left corona radiata.  Blood pressure was noted to be high on arrival and was controlled with a Cardene drip.  His blood pressure is now stable and he is ready for discharge.  Patient was noted to not have taken his blood pressure medications every day at home prior to admission.  Etiology of the ICH is likely uncontrolled hypertension.  #Acute L corona radiata / basal ganglia hemorrhage likely due to uncontrolled hypertension Code Stroke CT head showed acute hemorrhage in the left corona radiata measuring 2 x 1 x 1 cm. Repeat CT head w/o contrast showed unchanged acute hemorrhage in the left corona radiata. No progressive mass effect. CTA head & neck showed no intracranial large vessel occlusion or proximal high-grade arterial stenosis. No abnormal arterial enhancement is demonstrated in the region of the left corona radiata hemorrhage to suggest an underlying vascular lesion. MRI  showed unchanged 2 cm left corona radiata/basal ganglia hemorrhage with mild surrounding edema and no mass effect. Punctate focus of possible vascular enhancement within the hemorrhage. No findings highly suspicious for  an  underlying mass. Otherwise unremarkable appearance of the brain. 2D Echo EF > 75% LDL 80 HgbA1c pending VTE prophylaxis - SCDs       Diet    Diet Heart Room service appropriate? Yes; Fluid consistency: Thin        No antithrombotic prior to admission, now on No antithrombotic. Therapy recommendations:  pending evaluation Disposition:  pending medical clearance   Hypertensive emergency, resolved Home meds:  amlodipine-benazepril 5-10 mg Nicardipine stopped, now back on amlodipine-benazepril 5-10 mg Long-term BP goal normotensive   Hyperlipidemia Home meds:  none LDL 80, goal < 70 Consider adding pravastatin 10 mg on discharge High intensity statin not indicated. Start statin at discharge   Depression, unspecified Anxiety, unspecified See separate plan of care note Outpatient follow-up with psychiatry   Hypokalemia K 3.1 yesterday, repleted.  recheck pending   Other Stroke Risk Factors Cigarette smoker. Advised to stop smoking ETOH use, alcohol level <10, advised to reduce alcohol intake Substance abuse - UDS:  THC POSITIVE, Cocaine NONE DETECTED. Patient advised to stop using due to stroke risk.  RN Pressure Injury Documentation:     DISCHARGE EXAM Blood pressure (!) 151/98, pulse 82, temperature 97.7 F (36.5 C), temperature source Oral, resp. rate 17, height 5\' 5"  (1.651 m), weight 75.6 kg, SpO2 97 %. General: Alert, well-nourished well-developed patient in no acute distress Respiratory: Regular, unlabored respirations on room air  NEURO:  Mental Status: AA&Ox3  Speech/Language: speech is without dysarthria or aphasia.  Naming, repetition, fluency, and comprehension intact.  Cranial Nerves:  II: PERRL. III, IV, VI: EOMI. Eyelids elevate symmetrically.  V: Sensation is intact to light touch and symmetrical to face.  VII: Slight right facial droop VIII: hearing intact to voice. IX, X: Phonation is normal.  XII: tongue is midline without  fasciculations. Motor: 5/5 strength to all muscle groups tested, with diminished fine motor movements to right hand Tone: is normal and bulk is normal Sensation- Intact to light touch bilaterally.  Coordination: FTN intact bilaterally.  Gait- deferred   Discharge Diet       Diet   Diet Heart Room service appropriate? Yes; Fluid consistency: Thin   liquids  DISCHARGE PLAN Disposition: Home No antithrombotic for secondary stroke prevention secondary to ICH Ongoing stroke risk factor control by Primary Care Physician at time of discharge Follow-up PCP Fisher, , MD in 2 weeks. Follow-up in [facility] in 8 weeks, office to schedule an appointment.   32 minutes were spent preparing discharge.  Cortney E Demetrios Isaacs , MSN, AGACNP-BC [facility] See Amion for schedule and pager information 09/07/2022 1:23 PM   I have personally obtained history,examined this patient, reviewed notes, independently viewed imaging studies, participated in medical decision making and plan of care.ROS completed by me personally and pertinent positives fully documented  I have made any additions or clarifications directly to the above note. Agree with note above.    09/09/2022, MD Medical Director [facility] Pager: [phone] 09/07/2022 6:04 PM

## 2022-09-07 NOTE — Progress Notes (Signed)
PT PROGRESS NOTE UPDATE  PT recommending change in follow up PT from Home health to Outpatient PT. 09/07/2022  Jacinto Halim., PT Acute Rehabilitation Services [phone]  (office)

## 2022-09-07 NOTE — Discharge Instructions (Addendum)
Mr. Maffett, you were admitted with slurred speech and right sided weakness. These symptoms were found to be due to a left sided hemorrhagic stroke in the basal ganglia.  This was most likely caused by uncontrolled high blood pressure.  You will need to be sure to take your blood pressure medication every day to prevent a future stroke.  You will also need to stop smoking and reduce the amount of salt in your diet.  Please make an appointment at the stroke clinic 6-8 weeks after discharge.

## 2022-09-07 NOTE — Progress Notes (Signed)
Patient ready for discharge; discharge instructions given and reviewed; Rx sent electronically; MD paged to send BP medication as well; patient dressed and ready to leave; discharged out via volunteer services in a wheelchair; accompanied home by his girlfriend.

## 2022-09-11 ENCOUNTER — Telehealth: Payer: Self-pay | Admitting: *Deleted

## 2022-09-11 NOTE — Patient Outreach (Signed)
  Care Coordination [facility] Note Transition Care Management Follow-up Telephone Call Date of discharge and from where: [facility] [phone] How have you been since you were released from the hospital? I am feeling better. I am taking one day at a time Any questions or concerns? No  Items Reviewed: Did the pt receive and understand the discharge instructions provided? Yes  Medications obtained and verified? Yes  Patient has gotten his medications and his significant other is making sure he takes them on time.  Other? Yes  patient likes the nicotine gum better than the patch, so he is going to talk with Dr to see if he can go with the gum Any new allergies since your discharge? No  Dietary orders reviewed? No Do you have support at home? Yes   Home Care and Equipment/Supplies: Were home health services ordered? not applicable If so, what is the name of the agency? n  Has the agency set up a time to come to the patient's home? not applicable Were any new equipment or medical supplies ordered?  No What is the name of the medical supply agency? N/a Were you able to get the supplies/equipment? not applicable Do you have any questions related to the use of the equipment or supplies? No  Functional Questionnaire: (I = Independent and D = Dependent) ADLs: I  Bathing/Dressing- I  Meal Prep- I  Eating- I  Maintaining continence- I  Transferring/Ambulation- I  Managing Meds- I  Follow up appointments reviewed:  PCP Hospital f/u appt confirmed? Yes  Dr Sherrie Mustache [phone] 9:40 Specialist Hospital f/u appt confirmed? No  Are transportation arrangements needed? No  If their condition worsens, is the pt aware to call PCP or go to the Emergency Dept.? Yes Was the patient provided with contact information for the PCP's office or ED? Yes Was to pt encouraged to call back with questions or concerns? Yes  SDOH assessments and interventions completed:   Yes SDOH Interventions Today    Flowsheet Row Most  Recent Value  SDOH Interventions   Food Insecurity Interventions Intervention Not Indicated  Housing Interventions Intervention Not Indicated  Transportation Interventions Intervention Not Indicated  [Per friend her has transportation for now to Dr visits]  Utilities Interventions Intervention Not Indicated       Care Coordination Interventions:  No Care Coordination interventions needed at this time.   Encounter Outcome:  Pt. Visit Completed   Gean Maidens BSN RN [facility] Care Management [phone]

## 2022-09-12 ENCOUNTER — Ambulatory Visit: Payer: BC Managed Care – PPO | Admitting: Family Medicine

## 2022-09-12 ENCOUNTER — Encounter: Payer: Self-pay | Admitting: Family Medicine

## 2022-09-12 VITALS — BP 138/78 | HR 99 | Temp 97.6°F | Wt 159.4 lb

## 2022-09-12 DIAGNOSIS — F1220 Cannabis dependence, uncomplicated: Secondary | ICD-10-CM

## 2022-09-12 DIAGNOSIS — I10 Essential (primary) hypertension: Secondary | ICD-10-CM

## 2022-09-12 DIAGNOSIS — F17200 Nicotine dependence, unspecified, uncomplicated: Secondary | ICD-10-CM | POA: Diagnosis not present

## 2022-09-12 DIAGNOSIS — Z8673 Personal history of transient ischemic attack (TIA), and cerebral infarction without residual deficits: Secondary | ICD-10-CM

## 2022-09-12 NOTE — Assessment & Plan Note (Addendum)
Acute on chronic, does not have home BP cuff to check Goal <130 and/or <80 Discussed medication compliance assistance with use of alarms or reminders Has reduced nicotine intake via vape; however, has not met cessation goals Continue to have a high amount of stress as pt is sole earner for household and has a SO and 3 small children Currently out of work on STD 3 week follow up- plan to increase norvasc and/or lotensin to assist; currently at 5/10 mg respectively at this time Recommend cards referral for additional vascular studies given childhood htn

## 2022-09-12 NOTE — Assessment & Plan Note (Signed)
Chronic, stable Recommend cessation to assist with BP and further prevention of additional brain attacks

## 2022-09-12 NOTE — Assessment & Plan Note (Signed)
Chronic, improving Discussed further ways to assist with cessation Referrals placed

## 2022-09-12 NOTE — Assessment & Plan Note (Signed)
Small hemorrhagic stroke in s/o elevated BP; pt did not take BP meds x 5 weeks prior to CVA Has an appt with neuro Has not been contacted yet re rehab Continues to have R side deficits; however, improved since admission CTM

## 2022-09-12 NOTE — Progress Notes (Signed)
I,Connie R Striblin,acting as a Education administrator for [name], FNP.,have documented all relevant documentation on the behalf of [name], FNP,as directed by  [name], FNP while in the presence of [name], FNP.  Established patient visit   Patient: Eddie Evans   DOB: 26-Nov-1995   26 y.o. Male  MRN: [id number] Visit Date: 09/12/2022  Today's healthcare provider: [name], FNP  Introduced to nurse practitioner role and practice setting.  All questions answered.  Discussed provider/patient relationship and expectations.   Chief Complaint  Patient presents with   Hospitalization Follow-up   Subjective    HPI  Follow up Hospitalization  Patient was admitted to [facility] on 12/20 and discharged on 12/22. He was treated for stroke. Telephone follow up was done on 12/26 He reports excellent compliance with treatment. He reports this condition is resolved.  ----------------------------------------------------------------------------------------- - Pt was admitted to the hospital Tuesday of last week for a stroke. Pt states that he is having no concerns, other than speak which therapist said should come back in 6-8 weeks. Medications: Outpatient Medications Prior to Visit  Medication Sig   amLODipine (NORVASC) 5 MG tablet Take 1 tablet (5 mg total) by mouth daily.   benazepril (LOTENSIN) 10 MG tablet Take 1 tablet (10 mg total) by mouth daily.   [START ON 09/23/2023] nicotine (NICODERM CQ - DOSED IN MG/24 HOURS) 21 mg/24hr patch Place 1 patch (21 mg total) onto the skin daily for 14 days.   nicotine polacrilex (NICORETTE) 2 MG gum Take 1 each (2 mg total) by mouth as needed for smoking cessation.   No facility-administered medications prior to visit.    Review of Systems  Last CBC Lab Results  Component Value Date   WBC 8.2 09/05/2022   HGB 15.5 09/05/2022   HCT 46.4 09/05/2022   MCV 86.9 09/05/2022   MCH 29.0 09/05/2022   RDW 12.8 09/05/2022   PLT 293  00/76/2263   Last metabolic panel Lab Results  Component Value Date   GLUCOSE 101 (H) 09/07/2022   NA 137 09/07/2022   K 3.8 09/07/2022   CL 104 09/07/2022   CO2 23 09/07/2022   BUN 12 09/07/2022   CREATININE 0.84 09/07/2022   GFRNONAA >60 09/07/2022   CALCIUM 9.9 09/07/2022   PHOS 3.6 08/16/2020   PROT 7.5 09/05/2022   ALBUMIN 4.0 09/05/2022   BILITOT 0.8 09/05/2022   ALKPHOS 85 09/05/2022   AST 31 09/05/2022   ALT 27 09/05/2022   ANIONGAP 10 09/07/2022   Last lipids Lab Results  Component Value Date   CHOL 155 09/06/2022   HDL 48 09/06/2022   LDLCALC 80 09/06/2022   TRIG 134 09/06/2022   CHOLHDL 3.2 09/06/2022   Last hemoglobin A1c Lab Results  Component Value Date   HGBA1C 5.4 09/06/2022   Last thyroid functions Lab Results  Component Value Date   TSH 1.79 09/26/2010       Objective    BP 138/78 Comment: manual  Pulse 99   Temp 97.6 F (36.4 C) (Oral)   Wt 159 lb 6.4 oz (72.3 kg)   SpO2 100%   BMI 26.53 kg/m  BP Readings from Last 3 Encounters:  09/12/22 138/78  09/07/22 (!) 151/98  09/05/22 119/73   Wt Readings from Last 3 Encounters:  09/12/22 159 lb 6.4 oz (72.3 kg)  09/05/22 166 lb 10.7 oz (75.6 kg)  09/05/22 162 lb 0.6 oz (73.5 kg)   SpO2 Readings  from Last 3 Encounters:  09/12/22 100%  09/07/22 97%  09/05/22 97%   Physical Exam Vitals and nursing note reviewed.  Constitutional:      Appearance: Normal appearance. He is normal weight.  HENT:     Head: Normocephalic and atraumatic.  Eyes:     Pupils: Pupils are equal, round, and reactive to light.  Cardiovascular:     Rate and Rhythm: Normal rate and regular rhythm.     Pulses: Normal pulses.     Heart sounds: Normal heart sounds.  Pulmonary:     Effort: Pulmonary effort is normal.     Breath sounds: Normal breath sounds.  Musculoskeletal:        General: Normal range of motion.     Cervical back: Normal range of motion.     Comments: +3/5 R arm/hand +4/5 R leg/foot +5/5 L  throughout   Skin:    General: Skin is warm and dry.     Capillary Refill: Capillary refill takes less than 2 seconds.  Neurological:     General: No focal deficit present.     Mental Status: He is alert and oriented to person, place, and time. Mental status is at baseline.      No results found for any visits on 09/12/22.  Assessment & Plan     Problem List Items Addressed This Visit       Cardiovascular and Mediastinum   Hypertension    Acute on chronic, does not have home BP cuff to check Goal <130 and/or <80 Discussed medication compliance assistance with use of alarms or reminders Has reduced nicotine intake via vape; however, has not met cessation goals Continue to have a high amount of stress as pt is sole earner for household and has a SO and 3 small children Currently out of work on STD 3 week follow up- plan to increase norvasc and/or lotensin to assist; currently at 5/10 mg respectively at this time Recommend cards referral for additional vascular studies given childhood htn      Relevant Orders   AMB Referral to [place] (ACO Patients)   Ambulatory referral to Cardiology     Other   History of CVA (cerebrovascular accident) - Primary    Small hemorrhagic stroke in s/o elevated BP; pt did not take BP meds x 5 weeks prior to CVA Has an appt with neuro Has not been contacted yet re rehab Continues to have R side deficits; however, improved since admission CTM      Relevant Orders   AMB Referral to [place] (ACO Patients)   Ambulatory referral to Cardiology   Marijuana dependence ([place])    Chronic, stable Recommend cessation to assist with BP and further prevention of additional brain attacks       Relevant Orders   Ambulatory referral to Smoking Cessation Program   Ambulatory referral to Cardiology   Tobacco use disorder    Chronic, improving Discussed further ways to assist with cessation Referrals placed        Relevant Orders   Ambulatory referral to Smoking Cessation Program   Ambulatory referral to Cardiology   Return in about 3 weeks (around 10/03/2022) for blood pressure check, HTN management.     Vonna Kotyk, FNP, have reviewed all documentation for this visit. The documentation on 09/12/22 for the exam, diagnosis, procedures, and orders are all accurate and complete.  [name], [place] [phone] (phone) [phone] (fax)  [facility]

## 2022-09-13 ENCOUNTER — Telehealth: Payer: Self-pay | Admitting: *Deleted

## 2022-09-13 ENCOUNTER — Ambulatory Visit: Payer: BC Managed Care – PPO | Attending: Physical Therapy | Admitting: Physical Therapy

## 2022-09-13 NOTE — Telephone Encounter (Signed)
   Telephone encounter was:  Unsuccessful.  09/13/2022 Name: Eddie Evans MRN: [id number] DOB: Feb 09, 1996  Unsuccessful outbound call made today to assist with:  Transportation Needs , Food Insecurity, and housing   Outreach Attempt:  1st Attempt  A HIPAA compliant voice message was left requesting a return call.  Instructed patient to call back at [phone].  Yehuda Mao Greenauer -[facility] [place], Population Health [phone] [address] [place] , [place] [address] Email : [email or web address]

## 2022-09-13 NOTE — Progress Notes (Signed)
  Care Coordination   Note   09/13/2022 Name: Eddie Evans MRN: [id number] DOB: 22-Feb-1996  Eddie Evans is a 26 y.o. year old male who sees Fisher, Demetrios Isaacs, MD for primary care. I reached out to [name] by phone today to offer care coordination services.  Eddie Evans was given information about Care Coordination services today including:   The Care Coordination services include support from the care team which includes your Nurse Coordinator, Clinical Social Worker, or Pharmacist.  The Care Coordination team is here to help remove barriers to the health concerns and goals most important to you. Care Coordination services are voluntary, and the patient may decline or stop services at any time by request to their care team member.   Care Coordination Consent Status: Patient agreed to services and verbal consent obtained.   Follow up plan:  Telephone appointment with care coordination team member scheduled for:  09/14/22 with SW and 09/18/22 with RNCM   Encounter Outcome:  Pt. Scheduled  [facility] Coordination Care Guide  Direct Dial: [phone]

## 2022-09-13 NOTE — Progress Notes (Signed)
  Care Coordination  Outreach Note  09/13/2022 Name: Eddie Evans MRN: [id number] DOB: Feb 19, 1996   Care Coordination Outreach Attempts: An unsuccessful telephone outreach was attempted today to offer the patient information about available care coordination services as a benefit of their health plan.   Follow Up Plan:  Additional outreach attempts will be made to offer the patient care coordination information and services.   Encounter Outcome:  No Answer  Christie Nottingham  Care Coordination Care Guide  Direct Dial: [phone]

## 2022-09-14 ENCOUNTER — Encounter: Payer: Self-pay | Admitting: *Deleted

## 2022-09-14 NOTE — Telephone Encounter (Signed)
This encounter was created in error - please disregard.

## 2022-09-18 ENCOUNTER — Telehealth: Payer: Self-pay | Admitting: Family Medicine

## 2022-09-18 ENCOUNTER — Ambulatory Visit: Payer: Self-pay | Admitting: *Deleted

## 2022-09-18 NOTE — Patient Outreach (Signed)
  Care Coordination   09/18/2022 Name: Eddie Evans MRN: [id number] DOB: 01-Nov-1995   Care Coordination Outreach Attempts:  An unsuccessful telephone outreach was attempted for a scheduled appointment today.  Follow Up Plan:  Additional outreach attempts will be made to offer the patient care coordination information and services.   Encounter Outcome:  No Answer   Care Coordination Interventions:  No, not indicated    Valente David, RN, MSN, [facility] Care Management Care Management Coordinator [phone]

## 2022-09-18 NOTE — Telephone Encounter (Signed)
Sending disability form back for completion.  Please  complete and fax to number on form.  Also patient wants a copy please, so please contact Mikala Starling when done [phone]

## 2022-09-19 NOTE — Therapy (Incomplete)
OUTPATIENT PHYSICAL THERAPY NEURO EVALUATION   Patient Name: Eddie Evans MRN: [id number] DOB:07/23/1996, 27 y.o., male Today's Date: 09/19/2022   PCP: Birdie Sons, MD   REFERRING PROVIDER: Katy Apo, NP  END OF SESSION:   Past Medical History:  Diagnosis Date   Anxiety    Hypertension    Stroke [facility])    No past surgical history on file. Patient Active Problem List   Diagnosis Date Noted   History of CVA (cerebrovascular accident) 09/12/2022   Tobacco use disorder 09/12/2022   Marijuana dependence ([place]) 09/12/2022   Depression, unspecified 09/06/2022   ICH (intracerebral hemorrhage) ([place]) 09/05/2022   Anxiety disorder 04/18/2015   Hypertension 04/18/2015   Learning difficulty 09/15/2009   Mental or behavioral problem 09/15/2009    ONSET DATE: 09/07/2022  REFERRING DIAG: I61.9 (ICD-10-CM) - Nontraumatic intracerebral hemorrhage, unspecified cerebral location, unspecified laterality ([place])  THERAPY DIAG:  No diagnosis found.  Rationale for Evaluation and Treatment: Rehabilitation  SUBJECTIVE:                                                                                                                                                                                             SUBJECTIVE STATEMENT: *** Pt accompanied by: {accompnied:27141}  PERTINENT HISTORY: Pt with hx uncontrolled HTN who presented at [facility] ED on 09/06/22 with right hemiparesis, dysarthria, minimal aphasia and was found to have a small acute intracerebral hemorrhage in the left corona radiata.   PMH: HTN, ICH, Depression, Anxiety, Tobacco use disorder  PAIN:  Are you having pain? {OPRCPAIN:27236}  PRECAUTIONS: {Therapy precautions:24002}  WEIGHT BEARING RESTRICTIONS: {Yes ***/No:24003}  FALLS: Has patient fallen in last 6 months? {fallsyesno:27318}  LIVING ENVIRONMENT: Lives with: {OPRC lives with:25569::"lives with their family"} Lives in: {Lives in:25570} Stairs:  {opstairs:27293} Has following equipment at home: {Assistive devices:23999}  PLOF: {PLOF:24004}  PATIENT GOALS: ***  OBJECTIVE:   DIAGNOSTIC FINDINGS: ***  COGNITION: Overall cognitive status: {cognition:24006}   SENSATION: {sensation:27233}  COORDINATION: ***  EDEMA:  {edema:24020}  MUSCLE TONE: {LE tone:25568}  MUSCLE LENGTH: Hamstrings: Right *** deg; Left *** deg Thomas test: Right *** deg; Left *** deg  DTRs:  {DTR SITE:24025}  POSTURE: {posture:25561}  LOWER EXTREMITY ROM:     {AROM/PROM:27142}  Right Eval Left Eval  Hip flexion    Hip extension    Hip abduction    Hip adduction    Hip internal rotation    Hip external rotation    Knee flexion    Knee extension    Ankle dorsiflexion    Ankle plantarflexion    Ankle inversion    Ankle eversion     (  Blank rows = not tested)  LOWER EXTREMITY MMT:    MMT Right Eval Left Eval  Hip flexion    Hip extension    Hip abduction    Hip adduction    Hip internal rotation    Hip external rotation    Knee flexion    Knee extension    Ankle dorsiflexion    Ankle plantarflexion    Ankle inversion    Ankle eversion    (Blank rows = not tested)  BED MOBILITY:  {Bed mobility:24027}  TRANSFERS: Assistive device utilized: {Assistive devices:23999}  Sit to stand: {Levels of assistance:24026} Stand to sit: {Levels of assistance:24026} Chair to chair: {Levels of assistance:24026} Floor: {Levels of assistance:24026}  RAMP:  Level of Assistance: {Levels of assistance:24026} Assistive device utilized: {Assistive devices:23999} Ramp Comments: ***  CURB:  Level of Assistance: {Levels of assistance:24026} Assistive device utilized: {Assistive devices:23999} Curb Comments: ***  STAIRS: Level of Assistance: {Levels of assistance:24026} Stair Negotiation Technique: {Stair Technique:27161} with {Rail Assistance:27162} Number of Stairs: ***  Height of Stairs: ***  Comments: ***  GAIT: Gait  pattern: {gait characteristics:25376} Distance walked: *** Assistive device utilized: {Assistive devices:23999} Level of assistance: {Levels of assistance:24026} Comments: ***  FUNCTIONAL TESTS:  {Functional tests:24029}  PATIENT SURVEYS:  {rehab surveys:24030}  TODAY'S TREATMENT:                                                                                                                              DATE: ***    PATIENT EDUCATION: Education details: *** Person educated: {Person educated:25204} Education method: {Education Method:25205} Education comprehension: {Education Comprehension:25206}  HOME EXERCISE PROGRAM: ***  GOALS: Goals reviewed with patient? {yes/no:20286}  SHORT TERM GOALS: Target date: ***  *** Baseline: Goal status: {GOALSTATUS:25110}  2.  *** Baseline:  Goal status: {GOALSTATUS:25110}  3.  *** Baseline:  Goal status: {GOALSTATUS:25110}  4.  *** Baseline:  Goal status: {GOALSTATUS:25110}  5.  *** Baseline:  Goal status: {GOALSTATUS:25110}  6.  *** Baseline:  Goal status: {GOALSTATUS:25110}  LONG TERM GOALS: Target date: ***  *** Baseline:  Goal status: {GOALSTATUS:25110}  2.  *** Baseline:  Goal status: {GOALSTATUS:25110}  3.  *** Baseline:  Goal status: {GOALSTATUS:25110}  4.  *** Baseline:  Goal status: {GOALSTATUS:25110}  5.  *** Baseline:  Goal status: {GOALSTATUS:25110}  6.  *** Baseline:  Goal status: {GOALSTATUS:25110}  ASSESSMENT:  CLINICAL IMPRESSION: Patient is a *** y.o. *** who was seen today for physical therapy evaluation and treatment for ***.   OBJECTIVE IMPAIRMENTS: {opptimpairments:25111}.   ACTIVITY LIMITATIONS: {activitylimitations:27494}  PARTICIPATION LIMITATIONS: {participationrestrictions:25113}  PERSONAL FACTORS: {Personal factors:25162} are also affecting patient's functional outcome.   REHAB POTENTIAL: {rehabpotential:25112}  CLINICAL DECISION MAKING: {clinical decision  making:25114}  EVALUATION COMPLEXITY: {Evaluation complexity:25115}  PLAN:  PT FREQUENCY: {rehab frequency:25116}  PT DURATION: {rehab duration:25117}  PLANNED INTERVENTIONS: {rehab planned interventions:25118::"Therapeutic exercises","Therapeutic activity","Neuromuscular re-education","Balance training","Gait training","Patient/Family education","Self Care","Joint mobilization"}  PLAN FOR NEXT SESSION: ***   Ellis Koffler N Nicci Vaughan,  PT, DPT  09/19/2022, 4:19 PM

## 2022-09-20 ENCOUNTER — Ambulatory Visit: Payer: BC Managed Care – PPO | Attending: Family Medicine | Admitting: Physical Therapy

## 2022-09-20 ENCOUNTER — Telehealth: Payer: Self-pay | Admitting: *Deleted

## 2022-09-20 NOTE — Telephone Encounter (Signed)
   Telephone encounter was:  Unsuccessful.  09/20/2022 Name: Eddie Evans MRN: [id number] DOB: 12-07-1995  Unsuccessful outbound call made today to assist with:  Transportation Needs  and Food Insecurity  Outreach Attempt:  2nd Attempt  A HIPAA compliant voice message was left requesting a return call.  Instructed patient to call back at [phone].  [facility] [phone] [address] , [place] [address] Email : [email or web address]

## 2022-09-21 ENCOUNTER — Ambulatory Visit: Payer: Self-pay | Admitting: *Deleted

## 2022-09-21 NOTE — Patient Outreach (Addendum)
  Care Coordination   Initial Visit Note   09/21/2022 Name: CHASON MCIVER MRN: [id number] DOB: November 23, 1995  Christena Deem Venson is a 27 y.o. year old male who sees Fisher, Kirstie Peri, MD for primary care. I spoke with  Renato Shin by phone today.  What matters to the patients health and wellness today?  Patient states that he is no longer interested in substance abuse treatment. Denies need for mental health follow up at this time. Further denies having any additional community resource needs at this time.    Goals Addressed             This Visit's Progress    care coordination activities       Care Coordination Interventions: Patient assessed for community resource needs SDOH screen completed Patient declined need for substance abuse treatment at this time-denies mental health follow up as well Confirms follow up with Cardiologist-plan to start on 10/30/22 Patient encouraged to call this social worker if any additional community resource needs arise in the future           SDOH assessments and interventions completed:  Yes  SDOH Interventions Today    Flowsheet Row Most Recent Value  SDOH Interventions   Food Insecurity Interventions Intervention Not Indicated  Housing Interventions Intervention Not Indicated  Utilities Interventions Intervention Not Indicated  Financial Strain Interventions Intervention Not Indicated        Care Coordination Interventions:  No, not indicated   Follow up plan: No further intervention required.   Encounter Outcome:  Pt. Visit Completed

## 2022-09-21 NOTE — Patient Instructions (Addendum)
Visit Information  Thank you for taking time to visit with me today. Please don't hesitate to contact me if I can be of assistance to you.   Following are the goals we discussed today:   Goals Addressed             This Visit's Progress    care coordination activities       Care Coordination Interventions: Patient assessed for community resource needs SDOH screen completed Patient declined need for substance abuse treatment at this time-denies mental health follow up as well Confirms follow up with Cardiologist-plan to start on 10/30/22 Patient encouraged to call this social worker if any additional community resource needs arise in the future            Please call the care guide team at [phone] if you need to cancel or reschedule your appointment.   If you are experiencing a Mental Health or [place] or need someone to talk to, please call the Suicide and Crisis Lifeline: 988   Patient verbalizes understanding of instructions and care plan provided today and agrees to view in [place]. Active MyChart status and patient understanding of how to access instructions and care plan via MyChart confirmed with patient.     No further follow up required: patient to contact this Education officer, museum with any additional community resource needs  [employer], [place] Worker  [facility] Care Management [phone]

## 2022-09-21 NOTE — Telephone Encounter (Signed)
Patient advised. Per patient on their way to get them

## 2022-09-24 ENCOUNTER — Telehealth: Payer: Self-pay

## 2022-09-24 NOTE — Patient Outreach (Signed)
  [place] Stroke Care Coordination Follow Up  09/24/2022 Name:  Eddie Evans MRN:  [id number] DOB:  1996/08/07  Subjective: Eddie Evans is a 27 y.o. year old male who is a primary care patient of Caryn Section, Kirstie Peri, MD An Emmi alert was received indicating patient responded to questions: Questions/problems with meds?.  I reached out by phone to follow up on the alert. No, not indicated Care Coordination Interventions:  No, not indicated   Follow up plan:  RN CM will make outreach attempt to try to reach patient.    Encounter Outcome:  No Answer   Enzo Montgomery, RN,BSN,CCM [place] [facility] Telephonic Care Management Coordinator Direct Phone: [phone] Toll Free: [phone] Fax: [phone]

## 2022-09-24 NOTE — Patient Outreach (Signed)
  [place] [facility]roke Care Coordination Follow Up  09/24/2022 Name:  ARNE SCHLENDER MRN:  [id number] DOB:  05-Nov-1995  Subjective: Renato Shin is a 27 y.o. year old male who is a primary care patient of Birdie Sons, MD An Emmi alert was received on 09/21/22 indicating patient responded to questions: Questions/problems with meds?.   Incoming call from patient returning RN CM call. Patient reports he is doing okay. Denies any acute issus or concerns at this time. Reviewed and addressed red alert. Patient reports he only has about 14 pills left of his two BP meds and didn't know what to do. Advised patient that he has refill on both meds and instructed on how to contact pharmacy tor request refill. He voiced understanding. He does not monitor BP in the home. Strongly encouraged patient to obtain BP cuff and begin monitoring BP in the home. He voiced understanding. He has already completed PCP follow up appt and has neuro appt on 10/31/22. Denies any issues with transportation. Patient reports no further RN CM needs or concerns at this time.   Care Coordination Interventions:  Yes, provided   Follow up plan: No further intervention required. Patient has completed automated EMMI-STROKE post discharge calls.  Encounter Outcome:  Pt. Visit Completed   Enzo Montgomery, RN,BSN,CCM [facility] Telephonic Care Management Coordinator Direct Phone: [phone] Toll Free: [phone] Fax: [phone]

## 2022-09-24 NOTE — Patient Outreach (Signed)
Received a red flag Emmi stroke notification for Mr Mcinroy . I have assigned Enzo Montgomery, RN to call for follow up and determine if there are any Case Management needs.    [facility], [place], [place] Management [phone]

## 2022-09-26 ENCOUNTER — Telehealth: Payer: Self-pay | Admitting: *Deleted

## 2022-09-26 NOTE — Progress Notes (Signed)
  Care Coordination Note  09/26/2022 Name: Eddie Evans MRN: [id number] DOB: 1996-09-13  [name] is a 27 y.o. year old male who is a primary care patient of Caryn Section, Kirstie Peri, MD and is actively engaged with the care management team. I reached out to Renato Shin by phone today to assist with re-scheduling an initial visit with the RN Case Manager  Follow up plan: Unsuccessful telephone outreach attempt made. A HIPAA compliant phone message was left for the patient providing contact information and requesting a return call.   Julian Hy, [place] Direct Dial: [phone]

## 2022-09-26 NOTE — Telephone Encounter (Signed)
   Telephone encounter was:  Unsuccessful.  09/26/2022 Name: CHARLEY MISKE MRN: [id number] DOB: 1995-12-25  Unsuccessful outbound call made today to assist with:  Transportation Needs   Outreach Attempt:  3rd Attempt.  Referral closed unable to contact patient.  A HIPAA compliant voice message was left requesting a return call.  Instructed patient to call back at [phone].  [place] [phone] [address] , [place] [address] Email : [email or web address]

## 2022-10-02 ENCOUNTER — Ambulatory Visit: Payer: BC Managed Care – PPO | Admitting: Family Medicine

## 2022-10-05 ENCOUNTER — Ambulatory Visit: Payer: BC Managed Care – PPO | Admitting: Family Medicine

## 2022-10-05 NOTE — Progress Notes (Deleted)
      Established patient visit   Patient: Eddie Evans   DOB: October 20, 1995   26 y.o. Male  MRN: [id number] Visit Date: 10/05/2022  Today's healthcare provider: [name], FNP   No chief complaint on file.  Subjective    HPI  Hypertension, follow-up  BP Readings from Last 3 Encounters:  09/12/22 138/78  09/07/22 (!) 151/98  09/05/22 119/73   Wt Readings from Last 3 Encounters:  09/12/22 159 lb 6.4 oz (72.3 kg)  09/05/22 166 lb 10.7 oz (75.6 kg)  09/05/22 162 lb 0.6 oz (73.5 kg)     He was last seen for hypertension 1 months ago.  BP at that visit was 138/78. Management since that visit includes norvasc and/or lotensin to assist; currently at 5/10 mg .  He reports {excellent/good/fair/poor:19665} compliance with treatment. He {is/is not:9024} having side effects. {document side effects if present:1} He is following a {diet:21022986} diet. He {is/is not:9024} exercising. He {does/does not:200015} smoke.  Use of agents associated with hypertension: {bp agents assoc with hypertension:511::"none"}.   Outside blood pressures are {***enter patient reported home BP readings, or 'not being checked':1}. Symptoms: {Yes/No:20286} chest pain {Yes/No:20286} chest pressure  {Yes/No:20286} palpitations {Yes/No:20286} syncope  {Yes/No:20286} dyspnea {Yes/No:20286} orthopnea  {Yes/No:20286} paroxysmal nocturnal dyspnea {Yes/No:20286} lower extremity edema   Pertinent labs Lab Results  Component Value Date   CHOL 155 09/06/2022   HDL 48 09/06/2022   LDLCALC 80 09/06/2022   TRIG 134 09/06/2022   CHOLHDL 3.2 09/06/2022   Lab Results  Component Value Date   NA 137 09/07/2022   K 3.8 09/07/2022   CREATININE 0.84 09/07/2022   GFRNONAA >60 09/07/2022   GLUCOSE 101 (H) 09/07/2022   TSH 1.79 09/26/2010     The ASCVD Risk score (Arnett DK, et al., 2019) failed to calculate for the following reasons:   The 2019 ASCVD risk score is only valid for ages 32 to  47  ---------------------------------------------------------------------------------------------------   Medications: Outpatient Medications Prior to Visit  Medication Sig   amLODipine (NORVASC) 5 MG tablet Take 1 tablet (5 mg total) by mouth daily.   benazepril (LOTENSIN) 10 MG tablet Take 1 tablet (10 mg total) by mouth daily.   [START ON 09/23/2023] nicotine (NICODERM CQ - DOSED IN MG/24 HOURS) 21 mg/24hr patch Place 1 patch (21 mg total) onto the skin daily for 14 days.   nicotine polacrilex (NICORETTE) 2 MG gum Take 1 each (2 mg total) by mouth as needed for smoking cessation.   No facility-administered medications prior to visit.    Review of Systems  {Labs  Heme  Chem  Endocrine  Serology  Results Review (optional):23779}   Objective    There were no vitals taken for this visit. {Show previous vital signs (optional):23777}  Physical Exam  ***  No results found for any visits on 10/05/22.  Assessment & Plan     ***  No follow-ups on file.      {provider attestation***:1}   [name], [place] [phone] (phone) [phone] (fax)  [place]

## 2022-10-10 ENCOUNTER — Ambulatory Visit: Payer: BC Managed Care – PPO | Admitting: Family Medicine

## 2022-10-10 NOTE — Progress Notes (Deleted)
     I,Gethsemane Fischler R Jahni Nazar,acting as a Education administrator for [name], FNP.,have documented all relevant documentation on the behalf of [name], FNP,as directed by  [name], FNP while in the presence of [name], FNP.   Established patient visit   Patient: Eddie Evans   DOB: May 27, 1996   26 y.o. Male  MRN: [id number] Visit Date: 10/10/2022  Today's healthcare provider: [name], FNP   No chief complaint on file.  Subjective    HPI  Hypertension, follow-up  BP Readings from Last 3 Encounters:  09/12/22 138/78  09/07/22 (!) 151/98  09/05/22 119/73   Wt Readings from Last 3 Encounters:  09/12/22 159 lb 6.4 oz (72.3 kg)  09/05/22 166 lb 10.7 oz (75.6 kg)  09/05/22 162 lb 0.6 oz (73.5 kg)     He was last seen for hypertension 3 weeks ago.  BP at that visit was 138/78. Management since that visit includes: 3 week follow up- plan to increase norvasc and/or lotensin to assist; currently at 5/10 mg respectively at this time Recommend cards referral for additional vascular studies given childhood htn  He reports {excellent/good/fair/poor:19665} compliance with treatment. He {is/is not:9024} having side effects. {document side effects if present:1} He is following a {diet:21022986} diet. He {is/is not:9024} exercising. He {does/does not:200015} smoke.  Use of agents associated with hypertension: {bp agents assoc with hypertension:511::"none"}.   Outside blood pressures are {***enter patient reported home BP readings, or 'not being checked':1}. Symptoms: {Yes/No:20286} chest pain {Yes/No:20286} chest pressure  {Yes/No:20286} palpitations {Yes/No:20286} syncope  {Yes/No:20286} dyspnea {Yes/No:20286} orthopnea  {Yes/No:20286} paroxysmal nocturnal dyspnea {Yes/No:20286} lower extremity edema   Pertinent labs Lab Results  Component Value Date   CHOL 155 09/06/2022   HDL 48 09/06/2022   LDLCALC 80 09/06/2022   TRIG 134 09/06/2022   CHOLHDL 3.2 09/06/2022    Lab Results  Component Value Date   NA 137 09/07/2022   K 3.8 09/07/2022   CREATININE 0.84 09/07/2022   GFRNONAA >60 09/07/2022   GLUCOSE 101 (H) 09/07/2022   TSH 1.79 09/26/2010     The ASCVD Risk score (Arnett DK, et al., 2019) failed to calculate for the following reasons:   The 2019 ASCVD risk score is only valid for ages 55 to 10  ---------------------------------------------------------------------------------------------------   Medications: Outpatient Medications Prior to Visit  Medication Sig   amLODipine (NORVASC) 5 MG tablet Take 1 tablet (5 mg total) by mouth daily.   benazepril (LOTENSIN) 10 MG tablet Take 1 tablet (10 mg total) by mouth daily.   [START ON 09/23/2023] nicotine (NICODERM CQ - DOSED IN MG/24 HOURS) 21 mg/24hr patch Place 1 patch (21 mg total) onto the skin daily for 14 days.   nicotine polacrilex (NICORETTE) 2 MG gum Take 1 each (2 mg total) by mouth as needed for smoking cessation.   No facility-administered medications prior to visit.    Review of Systems  {Labs  Heme  Chem  Endocrine  Serology  Results Review (optional):23779}   Objective    There were no vitals taken for this visit. {Show previous vital signs (optional):23777}  Physical Exam  ***  No results found for any visits on 10/10/22.  Assessment & Plan     ***  No follow-ups on file.      {provider attestation***:1}   [name], [place] [phone] (phone) [phone] (fax)  [place]

## 2022-10-23 NOTE — Progress Notes (Signed)
  Care Coordination Note  10/23/2022 Name: Eddie Evans MRN: [id number] DOB: 1995/11/26  Eddie Evans is a 27 y.o. year old male who is a primary care patient of Caryn Section, Kirstie Peri, MD and is actively engaged with the care management team. I reached out to Renato Shin by phone today to assist with re-scheduling an initial visit with the RN Case Manager  Follow up plan: We have been unable to make contact with the patient for follow up.    Julian Hy, [place] Direct Dial: [phone]

## 2022-10-30 ENCOUNTER — Ambulatory Visit: Payer: BC Managed Care – PPO | Attending: Cardiovascular Disease | Admitting: Cardiovascular Disease

## 2022-10-30 ENCOUNTER — Encounter: Payer: Self-pay | Admitting: Cardiovascular Disease

## 2022-10-31 ENCOUNTER — Inpatient Hospital Stay: Payer: BC Managed Care – PPO | Admitting: Adult Health

## 2022-10-31 ENCOUNTER — Encounter: Payer: Self-pay | Admitting: Adult Health

## 2022-11-08 ENCOUNTER — Other Ambulatory Visit: Payer: Self-pay | Admitting: Family Medicine

## 2022-11-08 NOTE — Telephone Encounter (Signed)
Requested medications are due for refill today.  yes  Requested medications are on the active medications list.  yes  Last refill. 09/08/2022 #30 1 rf  Future visit scheduled.   No   Notes to clinic.  Rx signed by Katy Apo.    Requested Prescriptions  Pending Prescriptions Disp Refills   benazepril (LOTENSIN) 10 MG tablet [Pharmacy Med Name: BENAZEPRIL HCL 10 MG TABLET] 30 tablet 1    Sig: TAKE 1 TABLET BY MOUTH EVERY DAY     Cardiovascular:  ACE Inhibitors Passed - 11/08/2022  4:54 PM      Passed - Cr in normal range and within 180 days    Creatinine, Ser  Date Value Ref Range Status  09/07/2022 0.84 0.61 - 1.24 mg/dL Final         Passed - K in normal range and within 180 days    Potassium  Date Value Ref Range Status  09/07/2022 3.8 3.5 - 5.1 mmol/L Final         Passed - Patient is not pregnant      Passed - Last BP in normal range    BP Readings from Last 1 Encounters:  09/12/22 138/78         Passed - Valid encounter within last 6 months    Recent Outpatient Visits           1 month ago History of CVA (cerebrovascular accident)   [place] [name], FNP   2 years ago Primary hypertension   [place], Donald E, MD   2 years ago Primary hypertension   [place], Donald E, MD   3 years ago Essential hypertension   [place], Dionne Bucy, MD   3 years ago Essential hypertension   [place], Donald E, MD

## 2022-11-12 ENCOUNTER — Other Ambulatory Visit: Payer: Self-pay | Admitting: Family Medicine

## 2022-11-12 NOTE — Telephone Encounter (Unsigned)
Copied from [place] [id number]. Topic: General - Other >> Nov 12, 2022 10:24 AM Everette C wrote: Reason for CRM: Medication Refill - Medication: amLODipine (NORVASC) 5 MG tablet [id number]  benazepril (LOTENSIN) 10 MG tablet [id number]  Has the patient contacted their pharmacy? Yes.   (Agent: If no, request that the patient contact the pharmacy for the refill. If patient does not wish to contact the pharmacy document the reason why and proceed with request.) (Agent: If yes, when and what did the pharmacy advise?)  Preferred Pharmacy (with phone number or street name): [id number] [place] [place]Phone: [phone]: [phone]: Not open 24 hours   Has the patient been seen for an appointment in the last year OR does the patient have an upcoming appointment? Yes.    Agent: Please be advised that RX refills may take up to 3 business days. We ask that you follow-up with your pharmacy.

## 2022-11-13 NOTE — Telephone Encounter (Signed)
Requested medication (s) are due for refill today: yes  Requested medication (s) are on the active medication list: yes    Last refill: 09/07/22  #30  1 refill  Future visit scheduled  yes 11/16/22  Notes to clinic:Historical provider, please review. Thank you.  Requested Prescriptions  Pending Prescriptions Disp Refills   benazepril (LOTENSIN) 10 MG tablet 30 tablet 0    Sig: Take 1 tablet (10 mg total) by mouth daily.     Cardiovascular:  ACE Inhibitors Passed - 11/12/2022 11:20 AM      Passed - Cr in normal range and within 180 days    Creatinine, Ser  Date Value Ref Range Status  09/07/2022 0.84 0.61 - 1.24 mg/dL Final         Passed - K in normal range and within 180 days    Potassium  Date Value Ref Range Status  09/07/2022 3.8 3.5 - 5.1 mmol/L Final         Passed - Patient is not pregnant      Passed - Last BP in normal range    BP Readings from Last 1 Encounters:  09/12/22 138/78         Passed - Valid encounter within last 6 months    Recent Outpatient Visits           2 months ago History of CVA (cerebrovascular accident)   [place] [name], [place]   2 years ago Primary hypertension   [place], Donald E, MD   2 years ago Primary hypertension   [place], Donald E, MD   3 years ago Essential hypertension   [place], Dionne Bucy, MD   3 years ago Essential hypertension   [place], MD       Future Appointments             In 3 days [place], Kirstie Peri, MD [facility]             amLODipine (NORVASC) 5 MG tablet 30 tablet 1    Sig: Take 1 tablet (5 mg total) by mouth daily.     Cardiovascular: Calcium Channel Blockers 2 Passed - 11/12/2022 11:20 AM      Passed - Last BP in normal range    BP Readings from Last 1 Encounters:   09/12/22 138/78         Passed - Last Heart Rate in normal range    Pulse Readings from Last 1 Encounters:  09/12/22 99         Passed - Valid encounter within last 6 months    Recent Outpatient Visits           2 months ago History of CVA (cerebrovascular accident)   [place] [name], FNP   2 years ago Primary hypertension   [place], Donald E, MD   2 years ago Primary hypertension   [place], Donald E, MD   3 years ago Essential hypertension   [place], Dionne Bucy, MD   3 years ago Essential hypertension   [place], Donald E, MD       Future Appointments             In 3  days Birdie Sons, MD [facility], [facility]

## 2022-11-14 MED ORDER — BENAZEPRIL HCL 10 MG PO TABS: 10.0000 mg | ORAL_TABLET | Freq: Every day | ORAL | 0 refills | Status: DC

## 2022-11-14 MED ORDER — AMLODIPINE BESYLATE 5 MG PO TABS: 5.0000 mg | ORAL_TABLET | Freq: Every day | ORAL | 1 refills | Status: DC

## 2022-11-14 NOTE — Progress Notes (Signed)
[place],acting as a scribe for Lelon Huh, MD.,have documented all relevant documentation on the behalf of Lelon Huh, MD,as directed by  Lelon Huh, MD while in the presence of Lelon Huh, MD.     Established patient visit   Patient: Eddie Evans   DOB: 1996/04/09   27 y.o. Male  MRN: [id number] Visit Date: 11/16/2022  Today's healthcare provider: Lelon Huh, MD   No chief complaint on file.  Subjective    HPI  Hypertension, follow-up  BP Readings from Last 3 Encounters:  11/16/22 129/79  09/12/22 138/78  09/07/22 (!) 151/98   Wt Readings from Last 3 Encounters:  11/16/22 154 lb (69.9 kg)  09/12/22 159 lb 6.4 oz (72.3 kg)  09/05/22 166 lb 10.7 oz (75.6 kg)     He was last seen for hypertension 2 months ago.  BP at that visit was as above. Management since that visit includes no changes.  He reports good compliance with treatment. He is not having side effects.  He is following a Regular diet. He is not exercising. He does smoke.  Use of agents associated with hypertension: none.   Outside blood pressures are running 150's over 90's and 100's. Symptoms: No chest pain No chest pressure  No palpitations No syncope  No dyspnea No orthopnea  No paroxysmal nocturnal dyspnea No lower extremity edema   Pertinent labs Lab Results  Component Value Date   CHOL 155 09/06/2022   HDL 48 09/06/2022   LDLCALC 80 09/06/2022   TRIG 134 09/06/2022   CHOLHDL 3.2 09/06/2022   Lab Results  Component Value Date   NA 137 09/07/2022   K 3.8 09/07/2022   CREATININE 0.84 09/07/2022   GFRNONAA >60 09/07/2022   GLUCOSE 101 (H) 09/07/2022   TSH 1.79 09/26/2010     The ASCVD Risk score (Arnett DK, et al., 2019) failed to calculate for the following reasons:   The 2019 ASCVD risk score is only valid for ages 70 to 3  --------------------------------------------------------------------------------------------------- Patient is s/p hemorrhagic CVA  from December and has been out of work since.  He is here with his SO today. He had some dysarthria and weakness of right forearm an hand at discharge. He has been compliant with physical and occupational therapy and feels his right arm is ninety five percent better. He feels his speech is greatly improved.  Medications: Outpatient Medications Prior to Visit  Medication Sig   amLODipine (NORVASC) 5 MG tablet Take 1 tablet (5 mg total) by mouth daily.   benazepril (LOTENSIN) 10 MG tablet Take 1 tablet (10 mg total) by mouth daily.   [START ON 09/23/2023] nicotine (NICODERM CQ - DOSED IN MG/24 HOURS) 21 mg/24hr patch Place 1 patch (21 mg total) onto the skin daily for 14 days.   nicotine polacrilex (NICORETTE) 2 MG gum Take 1 each (2 mg total) by mouth as needed for smoking cessation.   No facility-administered medications prior to visit.    Review of Systems  Constitutional:  Negative for appetite change, chills and fever.  Respiratory:  Negative for chest tightness, shortness of breath and wheezing.   Cardiovascular:  Negative for chest pain and palpitations.  Gastrointestinal:  Negative for abdominal pain, nausea and vomiting.  Neurological:  Positive for speech difficulty and weakness. Negative for dizziness, tremors, seizures, syncope, facial asymmetry, light-headedness, numbness and headaches.       Objective    BP 129/79 (BP Location: Left Arm, Patient Position: Sitting, Cuff Size: Normal)  Pulse 88   Temp 98.6 F (37 C) (Oral)   Wt 154 lb (69.9 kg)   SpO2 100%   BMI 25.63 kg/m    Physical Exam   General: Appearance:    Well developed, well nourished male in no acute distress  Ears:    Both ear canals obstructed by cerumen    Lungs:     Clear to auscultation bilaterally, respirations unlabored  Heart:    Normal heart rate. Normal rhythm. No murmurs, rubs, or gallops.    MS:   All extremities are intact.    Neurologic:   Awake, alert, oriented x 3. No apparent focal  neurological defect. +5/5 MS both arms, wrist and grip. No defects of coordination. Slightly slowed speech from his baseline.         Assessment & Plan     1. History of hemorrhagic cerebrovascular accident (CVA) with residual deficit Has completed PT and nearly back to baseline strength and speech. There wer no vascular lesions on CTA, CVA was attributed entirely to uncontrolled hypertension.  2. Primary hypertension Well controlled, counseled importance of blood pressure control. Continue current medications.  Follow up in 3 months.   3. Tobacco use disorder Has stopped smoking, but is now vaping. Encouraged to stop all nicotine products.   4. Bilateral impacted cerumen Recommend OTC Debrox drops   Future Appointments  Date Time Provider [place]  03/22/2023 10:00 AM Caryn Section, Kirstie Peri, MD [facility]         The entirety of the information documented in the History of Present Illness, Review of Systems and Physical Exam were personally obtained by me. Portions of this information were initially documented by the CMA and reviewed by me for thoroughness and accuracy.     Lelon Huh, MD  [place] [phone] (phone) [phone] (fax)  [place]

## 2022-11-16 ENCOUNTER — Ambulatory Visit: Payer: BC Managed Care – PPO | Admitting: Family Medicine

## 2022-11-16 ENCOUNTER — Encounter: Payer: Self-pay | Admitting: Family Medicine

## 2022-11-16 VITALS — BP 129/79 | HR 88 | Temp 98.6°F | Wt 154.0 lb

## 2022-11-16 DIAGNOSIS — I10 Essential (primary) hypertension: Secondary | ICD-10-CM

## 2022-11-16 DIAGNOSIS — F17200 Nicotine dependence, unspecified, uncomplicated: Secondary | ICD-10-CM

## 2022-11-16 DIAGNOSIS — I6930 Unspecified sequelae of cerebral infarction: Secondary | ICD-10-CM | POA: Diagnosis not present

## 2022-11-16 DIAGNOSIS — H6123 Impacted cerumen, bilateral: Secondary | ICD-10-CM | POA: Diagnosis not present

## 2022-11-16 NOTE — Patient Instructions (Addendum)
Please review the attached list of medications and notify my office if there are any errors.   Continue to work on stopping smoking and all nicotine products.   You can use OTC Debrox ear drops (or a generic equivalent) to clear ear wax

## 2022-11-19 ENCOUNTER — Other Ambulatory Visit: Payer: Self-pay | Admitting: Family Medicine

## 2022-11-19 DIAGNOSIS — I10 Essential (primary) hypertension: Secondary | ICD-10-CM

## 2023-01-15 ENCOUNTER — Emergency Department: Payer: BC Managed Care – PPO

## 2023-01-15 ENCOUNTER — Emergency Department
Admission: EM | Admit: 2023-01-15 | Discharge: 2023-01-15 | Disposition: A | Discharge status: Home / Self Care | Payer: BC Managed Care – PPO | Attending: Emergency Medicine | Admitting: Emergency Medicine

## 2023-01-15 ENCOUNTER — Other Ambulatory Visit: Payer: Self-pay

## 2023-01-15 ENCOUNTER — Ambulatory Visit (INDEPENDENT_AMBULATORY_CARE_PROVIDER_SITE_OTHER): Payer: BC Managed Care – PPO | Admitting: Family Medicine

## 2023-01-15 DIAGNOSIS — I10 Essential (primary) hypertension: Secondary | ICD-10-CM | POA: Insufficient documentation

## 2023-01-15 DIAGNOSIS — R0789 Other chest pain: Secondary | ICD-10-CM | POA: Insufficient documentation

## 2023-01-15 DIAGNOSIS — Z8673 Personal history of transient ischemic attack (TIA), and cerebral infarction without residual deficits: Secondary | ICD-10-CM | POA: Insufficient documentation

## 2023-01-15 DIAGNOSIS — R079 Chest pain, unspecified: Secondary | ICD-10-CM

## 2023-01-15 DIAGNOSIS — Z91199 Patient's noncompliance with other medical treatment and regimen due to unspecified reason: Secondary | ICD-10-CM

## 2023-01-15 LAB — BASIC METABOLIC PANEL
Anion gap: 4 — ABNORMAL LOW (ref 5–15)
BUN: 11 mg/dL (ref 6–20)
CO2: 31 mmol/L (ref 22–32)
Calcium: 9.3 mg/dL (ref 8.9–10.3)
Chloride: 101 mmol/L (ref 98–111)
Creatinine, Ser: 0.72 mg/dL (ref 0.61–1.24)
GFR, Estimated: >60 mL/min (ref >60)
Glucose, Bld: 109 mg/dL — ABNORMAL HIGH (ref 70–99)
Potassium: 3.3 mmol/L — ABNORMAL LOW (ref 3.5–5.1)
Sodium: 136 mmol/L (ref 135–145)

## 2023-01-15 LAB — CBC
HCT: 45.2 % (ref 39.0–52.0)
Hemoglobin: 15.5 g/dL (ref 13.0–17.0)
MCH: 30.3 pg (ref 26.0–34.0)
MCHC: 34.3 g/dL (ref 30.0–36.0)
MCV: 88.5 fL (ref 80.0–100.0)
Platelets: 278 10*3/uL (ref 150–400)
RBC: 5.11 MIL/uL (ref 4.22–5.81)
RDW: 12.4 % (ref 11.5–15.5)
WBC: 12.7 10*3/uL — ABNORMAL HIGH (ref 4.0–10.5)
nRBC: 0 % (ref 0.0–0.2)

## 2023-01-15 LAB — TROPONIN I (HIGH SENSITIVITY)
Troponin I (High Sensitivity): <2 ng/L (ref <18)
Troponin I (High Sensitivity): <2 ng/L (ref <18)

## 2023-01-15 MED ORDER — AMLODIPINE BESYLATE 5 MG PO TABS: 5.0000 mg | ORAL_TABLET | Freq: Every day | ORAL | 11 refills | Status: DC

## 2023-01-15 MED ORDER — AMLODIPINE BESYLATE 5 MG PO TABS
5.0000 mg | ORAL_TABLET | Freq: Once | ORAL | Status: AC
  Administered 2023-01-15: 5 mg via ORAL
  Filled 2023-01-15: qty 1

## 2023-01-15 NOTE — ED Triage Notes (Signed)
Pt coming in with chest pain on and off for the last several days. Pt states it started again this morning, but currently has no pain. Pt denies shortness of breath.

## 2023-01-15 NOTE — Progress Notes (Unsigned)
  Patient presented to ED and did not attend OV. He should not be charged for a no show.   Jacky Kindle, FNP  [facility] [phone] (phone) [phone] (fax)  [facility]

## 2023-01-15 NOTE — ED Provider Notes (Signed)
[facility] Provider Note    Event Date/Time   First MD Initiated Contact with Patient 01/15/23 1400     (approximate)   History   Chest Pain   HPI  Eddie Evans is a 27 y.o. male with history of hypertension and hemorrhagic stroke presents emergency department with chest pain.  Patient states he had left-sided chest pain at work.  Radiated around to the mid section.  States he was already sweaty so unsure if he had diaphoresis.  No vomiting.  Symptoms have improved since arriving to the ED.  No shortness of breath.  Patient states he had a hemorrhagic CVA when he quit taking his blood pressure medication      Physical Exam   Triage Vital Signs: ED Triage Vitals  Enc Vitals Group     BP 01/15/23 1303 (!) 164/115     Pulse Rate 01/15/23 1303 97     Resp 01/15/23 1303 16     Temp 01/15/23 1303 98.4 F (36.9 C)     Temp Source 01/15/23 1303 Oral     SpO2 01/15/23 1303 98 %     Weight 01/15/23 1304 172 lb (78 kg)     Height 01/15/23 1304 5\' 3"  (1.6 m)     Head Circumference --      Peak Flow --      Pain Score 01/15/23 1304 0     Pain Loc --      Pain Edu? --      Excl. in GC? --     Most recent vital signs: Vitals:   01/15/23 1430 01/15/23 1500  BP: (!) 147/108 (!) 166/102  Pulse:    Resp:    Temp:    SpO2:       General: Awake, no distress.   CV:  Good peripheral perfusion. regular rate and  rhythm Resp:  Normal effort. Lungs cta Abd:  No distention.   Other:      ED Results / Procedures / Treatments   Labs (all labs ordered are listed, but only abnormal results are displayed) Labs Reviewed  BASIC METABOLIC PANEL - Abnormal; Notable for the following components:      Result Value   Potassium 3.3 (*)    Glucose, Bld 109 (*)    Anion gap 4 (*)    All other components within normal limits  CBC - Abnormal; Notable for the following components:   WBC 12.7 (*)    All other components within normal limits  TROPONIN I (HIGH  SENSITIVITY)  TROPONIN I (HIGH SENSITIVITY)     EKG  EKG   RADIOLOGY Chest x-ray    PROCEDURES:   Procedures   MEDICATIONS ORDERED IN ED: Medications  amLODipine (NORVASC) tablet 5 mg (5 mg Oral Given 01/15/23 1426)     IMPRESSION / MDM / ASSESSMENT AND PLAN / ED COURSE  I reviewed the triage vital signs and the nursing notes.                              Differential diagnosis includes, but is not limited to, MI, chest wall pain, PE, nonspecific chest pain  Patient's presentation is most consistent with acute presentation with potential threat to life or bodily function.   EKG independently reviewed interpreted by me as being normal sinus rhythm, see physician read  Chest x-ray was independently reviewed and interpreted by me as being negative for any acute  abnormality  Labs are reassuring  I did explain everything to the patient.  His first and second troponins are normal.  I did discuss admission for nonspecific chest pain but since the pain has decreased he feels comfortable going home.  Patient will have a ambulatory referral to cardiology for nonspecific chest pain.  Explained to him that with his past medical history of a hemorrhagic stroke and hypertension especially at age 30 and feel that he should follow-up with cardiology for evaluation.  Patient states he never followed up with neurology due to a financial concern. Patient is in agreement with this treatment plan.  He was given a work note.  I did increase his blood pressure medication to amlodipine 10 mg.  Sent to the pharmacy.  Patient was discharged in stable condition.      FINAL CLINICAL IMPRESSION(S) / ED DIAGNOSES   Final diagnoses:  Nonspecific chest pain  History of CVA (cerebrovascular accident)     Rx / DC Orders   ED Discharge Orders          Ordered    Ambulatory referral to Cardiology        01/15/23 1556             Note:  This document was prepared using Dragon voice  recognition software and may include unintentional dictation errors.    Faythe Ghee, PA-C 01/15/23 1658    Phineas Semen, MD 01/18/23 1520

## 2023-02-22 ENCOUNTER — Telehealth: Payer: Self-pay | Admitting: Family Medicine

## 2023-02-22 NOTE — Telephone Encounter (Signed)
Appointment made for 6/14 @ 8:15 as a work in.  Pt verbalizes understanding

## 2023-02-22 NOTE — Telephone Encounter (Signed)
He wants to be seen as soon as possible for an STI check but I let him know there was nothing till July and he wanted to speak to a nurse.

## 2023-02-25 ENCOUNTER — Emergency Department
Admission: EM | Admit: 2023-02-25 | Discharge: 2023-02-25 | Disposition: A | Discharge status: Home / Self Care | Payer: BC Managed Care – PPO | Attending: Emergency Medicine | Admitting: Emergency Medicine

## 2023-02-25 ENCOUNTER — Other Ambulatory Visit: Payer: Self-pay

## 2023-02-25 DIAGNOSIS — L03116 Cellulitis of left lower limb: Secondary | ICD-10-CM

## 2023-02-25 MED ORDER — CEPHALEXIN 500 MG PO CAPS: 500.0000 mg | ORAL_CAPSULE | Freq: Four times a day (QID) | ORAL | 0 refills | Status: AC

## 2023-02-25 NOTE — ED Triage Notes (Signed)
Spider bite to left calf.  Patient states he 'popped' it a few days ago.  Area redeneded.

## 2023-02-25 NOTE — Discharge Instructions (Addendum)
Take antibiotics as prescribed and have your doctor recheck the area later this week.   Take acetaminophen 650 mg and ibuprofen 400 mg every 6 hours for pain.  Take with food.

## 2023-02-25 NOTE — ED Provider Notes (Signed)
[facility] Provider Note    Event Date/Time   First MD Initiated Contact with Patient 02/25/23 1810     (approximate)   History   Insect Bite   HPI  Eddie Evans is a 27 y.o. male   Young man presents to the emergency room with a bug bite on his left calf which was itchy approximately 4 days ago but has developed into a red, warm, painful area concerning for infection.  He has had no purulence or drainage from the wound.  He has had no systemic symptoms of fevers or chills.     Physical Exam   Triage Vital Signs: ED Triage Vitals  Enc Vitals Group     BP 02/25/23 1747 (!) 158/117     Pulse Rate 02/25/23 1747 80     Resp 02/25/23 1747 16     Temp 02/25/23 1747 98.5 F (36.9 C)     Temp Source 02/25/23 1747 Oral     SpO2 02/25/23 1747 96 %     Weight 02/25/23 1746 171 lb 15.3 oz (78 kg)     Height 02/25/23 1746 5\' 3"  (1.6 m)     Head Circumference --      Peak Flow --      Pain Score 02/25/23 1746 7     Pain Loc --      Pain Edu? --      Excl. in GC? --     Most recent vital signs: Vitals:   02/25/23 1747  BP: (!) 158/117  Pulse: 80  Resp: 16  Temp: 98.5 F (36.9 C)  SpO2: 96%    General: Awake, no distress.  CV:  Good peripheral perfusion.  Resp:  Normal effort.  Abd:  No distention.  Other:  Cellulitic changes surrounding the initial bug bite lesion on his left calf.  No drainage no fluctuance to suggest abscess.  No fever.  Well appearance nontoxic   ED Results / Procedures / Treatments   Labs (all labs ordered are listed, but only abnormal results are displayed) Labs Reviewed - No data to display  PROCEDURES:  Critical Care performed: No  Procedures   MEDICATIONS ORDERED IN ED: Medications - No data to display   IMPRESSION / MDM / ASSESSMENT AND PLAN / ED COURSE  I reviewed the triage vital signs and the nursing notes.                                Patient's presentation is most consistent with acute  presentation with potential threat to life or bodily function.  Differential diagnosis includes, but is not limited to, cellulitis, abscess, sepsis   The patient is on the cardiac monitor to evaluate for evidence of arrhythmia and/or significant heart rate changes.  MDM:    Cellulitic changes without evidence of abscess will antibiosis with Keflex.  Looks well, nontoxic doubt sepsis.  Discharged with PMD follow-up.       FINAL CLINICAL IMPRESSION(S) / ED DIAGNOSES   Final diagnoses:  Cellulitis of left lower extremity     Rx / DC Orders   ED Discharge Orders          Ordered    cephALEXin (KEFLEX) 500 MG capsule  4 times daily        02/25/23 1854             Note:  This document was prepared using Dragon  voice recognition software and may include unintentional dictation errors.    Pilar Jarvis, MD 02/25/23 [phone]

## 2023-03-01 ENCOUNTER — Ambulatory Visit: Payer: Self-pay | Admitting: Family Medicine

## 2023-03-01 ENCOUNTER — Encounter: Payer: Self-pay | Admitting: Family Medicine

## 2023-03-01 DIAGNOSIS — Z113 Encounter for screening for infections with a predominantly sexual mode of transmission: Secondary | ICD-10-CM

## 2023-03-01 DIAGNOSIS — Z202 Contact with and (suspected) exposure to infections with a predominantly sexual mode of transmission: Secondary | ICD-10-CM

## 2023-03-01 MED ORDER — METRONIDAZOLE 500 MG PO TABS
2000.0000 mg | ORAL_TABLET | Freq: Once | ORAL | 0 refills | Status: AC
Start: 2023-03-01 — End: 2023-03-01

## 2023-03-01 NOTE — Progress Notes (Signed)
Pt is here as a contact to [employer].  Pt declined any STD testing.   The patient was dispensed Metronidazole 500 #4 today. I provided counseling today regarding the medication. We discussed the medication, the side effects and when to call clinic. Patient given the opportunity to ask questions. Questions answered.  Condoms declined.  Berdie Ogren, RN

## 2023-03-01 NOTE — Progress Notes (Signed)
[facility] Department STI clinic/screening visit  Subjective:  Eddie Evans is a 27 y.o. male being seen today for an STI screening visit. The patient reports they do not have symptoms.    Patient has the following medical conditions:   Patient Active Problem List   Diagnosis Date Noted   History of CVA (cerebrovascular accident) 09/12/2022   Tobacco use disorder 09/12/2022   Marijuana dependence (HCC) 09/12/2022   Depression, unspecified 09/06/2022   ICH (intracerebral hemorrhage) (HCC) 09/05/2022   Anxiety disorder 04/18/2015   Hypertension 04/18/2015   Learning difficulty 09/15/2009   Mental or behavioral problem 09/15/2009     Chief Complaint  Patient presents with   SEXUALLY TRANSMITTED DISEASE    States partner has an std but unsure which one.    HPI  Patient reports to clinic as an exposure to trichomoniasis. Denies symptoms  Last HIV test per patient/review of record was No results found for: "HMHIVSCREEN"  Lab Results  Component Value Date   HIV Non Reactive 09/05/2022    Does the patient or their partner desires a pregnancy in the next year? No  Screening for MPX risk: Does the patient have an unexplained rash? No Is the patient MSM? No Does the patient endorse multiple sex partners or anonymous sex partners? No Did the patient have close or sexual contact with a person diagnosed with MPX? No Has the patient traveled outside the [place] where MPX is endemic? No Is there a high clinical suspicion for MPX-- evidenced by one of the following No  -Unlikely to be chickenpox  -Lymphadenopathy  -Rash that present in same phase of evolution on any given body part   See flowsheet for further details and programmatic requirements.   Immunization History  Administered Date(s) Administered   DTaP 09/06/1997, 11/08/1997, 01/20/1998, 10/26/1998, 03/08/2003   HIB (PRP-OMP) 09/06/1997, 11/08/1997, 10/26/1998   HPV 9-valent 04/22/2015   Hepatitis A  06/13/2009   Hepatitis B 09/27/1996, 08/16/1997, 01/20/1998   IPV 09/06/1997, 11/08/1997, 07/11/1998, 03/08/2003   Influenza,inj,Quad PF,6+ Mos 12/05/2018   MMR 07/11/1998, 03/08/2003   Meningococcal B, OMV 04/22/2015   Meningococcal Conjugate 06/13/2009, 04/22/2015   Pneumococcal-Unspecified 09/06/1997, 11/08/1997, 10/26/2000   Td 04/21/2009   Tdap 04/21/2009   Varicella 07/11/1998, 04/21/2009     The following portions of the patient's history were reviewed and updated as appropriate: allergies, current medications, past medical history, past social history, past surgical history and problem list.  Objective:  There were no vitals filed for this visit.  Physical Exam Vitals and nursing note reviewed.  Constitutional:      Appearance: Normal appearance.  HENT:     Head: Normocephalic and atraumatic.     Mouth/Throat:     Mouth: Mucous membranes are moist.     Pharynx: No oropharyngeal exudate or posterior oropharyngeal erythema.  Eyes:     General:        Right eye: No discharge.        Left eye: No discharge.     Conjunctiva/sclera:     Right eye: Right conjunctiva is not injected. No exudate.    Left eye: Left conjunctiva is not injected. No exudate. Pulmonary:     Effort: Pulmonary effort is normal.  Abdominal:     General: Abdomen is flat.     Palpations: Abdomen is soft. There is no hepatomegaly or mass.     Tenderness: There is no abdominal tenderness. There is no rebound.  Genitourinary:    Comments: Declined genital exam-  asymptomatic Lymphadenopathy:     Cervical: No cervical adenopathy.     Upper Body:     Right upper body: No supraclavicular or axillary adenopathy.     Left upper body: No supraclavicular or axillary adenopathy.  Skin:    General: Skin is warm and dry.  Neurological:     Mental Status: He is alert and oriented to person, place, and time.       Assessment and Plan:  Eddie Evans is a 27 y.o. male presenting to the [facility] Department for STI screening  1. Exposure to trichomonas  - metroNIDAZOLE (FLAGYL) 500 MG tablet; Take 4 tablets (2,000 mg total) by mouth once for 1 dose.  Dispense: 4 tablet; Refill: 0   Patient does not have STI symptoms Patient refused all testing today. Patient meets criteria for HepB screening? No. Ordered? not applicable Patient meets criteria for HepC screening? No. Ordered? not applicable Recommended condom use with all sex Discussed importance of condom use for STI prevent   Return if symptoms worsen or fail to improve, for STI screening.  Future Appointments  Date Time Provider Department Center  03/04/2023 10:15 AM Sadie Haber, PT [facility]  03/22/2023 10:00 AM Malva Limes, MD [facility]  03/29/2023 11:40 AM Debbe Odea, MD [facility] None  Total time spent 15 minutes  Lenice Llamas, [place]

## 2023-03-04 ENCOUNTER — Ambulatory Visit: Payer: BC Managed Care – PPO | Admitting: Physical Therapy

## 2023-03-04 ENCOUNTER — Other Ambulatory Visit: Payer: Self-pay | Admitting: Family Medicine

## 2023-03-04 ENCOUNTER — Encounter: Payer: Self-pay | Admitting: Physical Therapy

## 2023-03-04 VITALS — BP 161/110 | HR 73

## 2023-03-04 DIAGNOSIS — I69151 Hemiplegia and hemiparesis following nontraumatic intracerebral hemorrhage affecting right dominant side: Secondary | ICD-10-CM

## 2023-03-04 DIAGNOSIS — M6281 Muscle weakness (generalized): Secondary | ICD-10-CM

## 2023-03-04 DIAGNOSIS — R2689 Other abnormalities of gait and mobility: Secondary | ICD-10-CM

## 2023-03-04 MED ORDER — BENAZEPRIL HCL 10 MG PO TABS
10.0000 mg | ORAL_TABLET | Freq: Every day | ORAL | 0 refills | Status: DC
  Filled 2023-03-04 – 2023-03-29 (×2): qty 30, 30d supply, fill #0

## 2023-03-04 NOTE — Therapy (Signed)
[facility] [address] [place], [place], [address] Phone: [phone]   Fax:  [phone]  Patient Details - ARRIVED NO CHARGE Name: Eddie Evans MRN: [id number] Date of Birth: 09/29/1995 Referring Provider:  Malva Limes, MD  Encounter Date: 03/04/2023  Today's Vitals   03/04/23 1027 03/04/23 1031  BP: (!) 146/105 (!) 161/110  Pulse: 73 73   There is no height or weight on file to calculate BMI. Patient has elevated BP readings outside parameters for PT.  He reports having no symptoms at current including hemibody sensation or strength changes, headache, facial asymmetry, or visual changes.  He did have a headache yesterday and this morning but has taken a Goody powder.  PT encouraged ED follow-up if symptoms as mentioned prior arise or BP continues to trend high over next 24 hours in consideration of prior HTN related CVA and persistent elevated BP despite increased medication dosage x4 weeks ago.  Both patient and wife state compliance to medication regimen in that time frame and report he took BP medicine at 9:15am today.  He is in agreement to PT reaching out to PCP regarding this and rescheduling evaluation to allow time for PCP contact with patient and medication adjustment if seen necessary.  He agrees to follow-up with PCP in 48 hours if not contacted by office.  Sadie Haber, PT, DPT 03/04/2023, 10:41 AM  [facility] [address] [place], [place], [address] Phone: [phone]   Fax:  [phone]

## 2023-03-05 ENCOUNTER — Other Ambulatory Visit: Payer: Self-pay

## 2023-03-05 ENCOUNTER — Other Ambulatory Visit (HOSPITAL_COMMUNITY): Payer: Self-pay

## 2023-03-05 ENCOUNTER — Encounter: Payer: Self-pay | Admitting: Pharmacist

## 2023-03-08 ENCOUNTER — Other Ambulatory Visit: Payer: Self-pay

## 2023-03-22 ENCOUNTER — Ambulatory Visit: Payer: BC Managed Care – PPO | Admitting: Family Medicine

## 2023-03-22 ENCOUNTER — Telehealth: Payer: Self-pay

## 2023-03-22 NOTE — Telephone Encounter (Signed)
The patient has a new patient appointment scheduled with Dr. Azucena Cecil on 03/29/23. Attempted to call the patient to inquire about cardiac history and leave a message, but was unable to leave a message due to his voice mailbox being full.

## 2023-03-29 ENCOUNTER — Other Ambulatory Visit: Payer: Self-pay

## 2023-03-29 ENCOUNTER — Encounter: Payer: Self-pay | Admitting: Pharmacist

## 2023-03-29 ENCOUNTER — Ambulatory Visit: Payer: BC Managed Care – PPO | Attending: Cardiology | Admitting: Cardiology

## 2023-03-29 ENCOUNTER — Ambulatory Visit: Payer: BC Managed Care – PPO | Admitting: Physical Therapy

## 2023-03-29 ENCOUNTER — Encounter: Payer: Self-pay | Admitting: Cardiology

## 2023-03-29 VITALS — BP 160/110 | HR 78 | Ht 64.0 in | Wt 155.8 lb

## 2023-03-29 DIAGNOSIS — R072 Precordial pain: Secondary | ICD-10-CM

## 2023-03-29 DIAGNOSIS — I10 Essential (primary) hypertension: Secondary | ICD-10-CM | POA: Diagnosis not present

## 2023-03-29 DIAGNOSIS — F419 Anxiety disorder, unspecified: Secondary | ICD-10-CM | POA: Diagnosis not present

## 2023-03-29 MED ORDER — AMLODIPINE BESYLATE 5 MG PO TABS: 5.0000 mg | ORAL_TABLET | Freq: Every day | ORAL | 3 refills | Status: DC

## 2023-03-29 MED ORDER — METOPROLOL TARTRATE 100 MG PO TABS: 100.0000 mg | ORAL_TABLET | Freq: Once | ORAL | 0 refills | Status: DC

## 2023-03-29 NOTE — Progress Notes (Signed)
Cardiology Office Note:    Date:  03/29/2023   ID:  Eddie Evans, DOB 1995-12-12, MRN [id number]  PCP:  Malva Limes, MD   [facility] Providers Cardiologist:  Debbe Odea, MD     Referring MD: Malva Limes, MD   Chief Complaint  Patient presents with   New Patient (Initial Visit)    Referred for chest pain and history of CVA.  Patient reports chest pains when hot outside or he is under stress.  Not previously seen by cardiologist.  Blood pressure elevated on office check 160/112, 160/110.   Eddie Evans is a 27 y.o. male who is being seen today for the evaluation of chest pain at the request of Fisher, Demetrios Isaacs, MD.   History of Present Illness:    Eddie Evans is a 27 y.o. male with a hx of hypertension, current e-cigarette use, anxiety who presents due to chest pain.  States having on and off chest pain for several months now.  Symptoms usually occur when he gets anxious or feels overwhelmed at his job.  Uses e-cigarettes.  Was previously on benazepril and amlodipine, he currently takes benazepril only.  BP elevated today.  Prior notes/studies Echo 08/2022 EF over 70%.  Past Medical History:  Diagnosis Date   Anxiety    Hypertension    Stroke [facility])     History reviewed. No pertinent surgical history.  Current Medications: Current Meds  Medication Sig   amLODipine (NORVASC) 5 MG tablet Take 1 tablet (5 mg total) by mouth daily.   benazepril (LOTENSIN) 10 MG tablet Take 1 tablet (10 mg total) by mouth daily. Please schedule office visit for follow up.   metoprolol tartrate (LOPRESSOR) 100 MG tablet Take 1 tablet (100 mg total) by mouth once for 1 dose. TWO HOURS PRIOR TO CARDIAC CTA     Allergies:   Tape   Social History   Socioeconomic History   Marital status: Single    Spouse name: Not on file   Number of children: Not on file   Years of education: Not on file   Highest education level: Not on file  Occupational History    Occupation: High school student    Comment: [employer]  Tobacco Use   Smoking status: Former    Current packs/day: 0.00    Types: Cigarettes, E-cigarettes    Quit date: 08/2022    Years since quitting: 0.6   Smokeless tobacco: Former    Types: Snuff, Chew   Tobacco comments:    1 pack every 2 weeks  Vaping Use   Vaping status: Every Day  Substance and Sexual Activity   Alcohol use: Yes    Comment: daily   Drug use: Yes    Types: Marijuana   Sexual activity: Not on file  Other Topics Concern   Not on file  Social History Narrative   Not on file   Social Determinants of Health   Financial Resource Strain: Medium Risk (09/21/2022)   Overall Financial Resource Strain (CARDIA)    Difficulty of Paying Living Expenses: Somewhat hard  Food Insecurity: No Food Insecurity (09/21/2022)   Hunger Vital Sign    Worried About Running Out of Food in the Last Year: Never true    Ran Out of Food in the Last Year: Never true  Transportation Needs: Unmet Transportation Needs (09/11/2022)   PRAPARE - Administrator, Civil Service (Medical): Yes    Lack of Transportation (Non-Medical):  Yes  Physical Activity: Not on file  Stress: Not on file  Social Connections: Not on file     Family History: The patient's family history includes Diabetes in his mother; Hyperlipidemia in his mother; Hypertension in his mother.  ROS:   Please see the history of present illness.     All other systems reviewed and are negative.  EKGs/Labs/Other Studies Reviewed:    The following studies were reviewed today:  EKG Interpretation Date/Time:  Friday March 29 2023 12:07:02 EDT Ventricular Rate:  78 PR Interval:  110 QRS Duration:  102 QT Interval:  374 QTC Calculation: 426 R Axis:   54  Text Interpretation: Sinus rhythm with short PR Nonspecific T wave abnormality Confirmed by Debbe Odea ([id number]) on 03/29/2023 12:10:20 PM    Recent Labs: 09/05/2022: ALT 27 01/15/2023: BUN 11;  Creatinine, Ser 0.72; Hemoglobin 15.5; Platelets 278; Potassium 3.3; Sodium 136  Recent Lipid Panel    Component Value Date/Time   CHOL 155 09/06/2022 0450   CHOL 163 08/16/2020 1636   TRIG 134 09/06/2022 0450   HDL 48 09/06/2022 0450   HDL 57 08/16/2020 1636   CHOLHDL 3.2 09/06/2022 0450   VLDL 27 09/06/2022 0450   LDLCALC 80 09/06/2022 0450   LDLCALC 75 08/16/2020 1636     Risk Assessment/Calculations:     HYPERTENSION CONTROL Vitals:   03/29/23 1200 03/29/23 1208  BP: (!) 160/112 (!) 160/110    The patient's blood pressure is elevated above target today.  In order to address the patient's elevated BP: A new medication was prescribed today.        Physical Exam:    VS:  BP (!) 160/110 (BP Location: Right Arm, Patient Position: Sitting, Cuff Size: Normal)   Pulse 78   Ht 5\' 4"  (1.626 m)   Wt 155 lb 12.8 oz (70.7 kg)   SpO2 99%   BMI 26.74 kg/m     Wt Readings from Last 3 Encounters:  03/29/23 155 lb 12.8 oz (70.7 kg)  02/25/23 171 lb 15.3 oz (78 kg)  01/15/23 172 lb (78 kg)     GEN:  Well nourished, well developed in no acute distress HEENT: Normal NECK: No JVD; No carotid bruits CARDIAC: RRR, no murmurs, rubs, gallops RESPIRATORY:  Clear to auscultation without rales, wheezing or rhonchi  ABDOMEN: Soft, non-tender, non-distended MUSCULOSKELETAL:  No edema; No deformity  SKIN: Warm and dry NEUROLOGIC:  Alert and oriented x 3 PSYCHIATRIC:  Normal affect   ASSESSMENT:    1. Precordial pain   2. Primary hypertension   3. Anxiety    PLAN:    In order of problems listed above:  Chest pain, associated with stress/anxiety.  Recent echo with normal EF.  Obtain coronary CTA to rule out CAD. Hypertension, BP elevated, restart Norvasc 5 mg daily, continue benazepril. History of anxiety, follow-up with PCP regarding anxiety management.  Follow-up after coronary CT     Medication Adjustments/Labs and Tests Ordered: Current medicines are reviewed at  length with the patient today.  Concerns regarding medicines are outlined above.  Orders Placed This Encounter  Procedures   CT CORONARY MORPH W/CTA COR W/SCORE W/CA W/CM &/OR WO/CM   EKG 12-Lead   Meds ordered this encounter  Medications   amLODipine (NORVASC) 5 MG tablet    Sig: Take 1 tablet (5 mg total) by mouth daily.    Dispense:  180 tablet    Refill:  3   metoprolol tartrate (LOPRESSOR) 100 MG tablet  Sig: Take 1 tablet (100 mg total) by mouth once for 1 dose. TWO HOURS PRIOR TO CARDIAC CTA    Dispense:  1 tablet    Refill:  0    Patient Instructions  Medication Instructions:   START Amlodipine - Take one tablet ( 5mg ) by mouth daily.   *If you need a refill on your cardiac medications before your next appointment, please call your pharmacy*   Lab Work:  None labs.   If you have labs (blood work) drawn today and your tests are completely normal, you will receive your results only by: MyChart Message (if you have MyChart) OR A paper copy in the mail If you have any lab test that is abnormal or we need to change your treatment, we will call you to review the results.   Testing/Procedures:    Your cardiac CT will be scheduled at one of the below locations:   [facility] [address] [place], [place] [address] [phone]  OR   [facility] [address] [place], [place] [address] [phone]     If scheduled at [facility] or [facility], please arrive 15 mins early for check-in and test prep. There is spacious parking and easy access to the radiology department from the [facility] entrance. Please enter here and check-in with the desk attendant.    Please follow these instructions carefully (unless otherwise directed):  An IV will be required for this test and Nitroglycerin will be given.  Hold all erectile  dysfunction medications at least 3 days (72 hrs) prior to test. (Ie viagra, cialis, sildenafil, tadalafil, etc)   On the Night Before the Test: Be sure to Drink plenty of water. Do not consume any caffeinated/decaffeinated beverages or chocolate 12 hours prior to your test. Do not take any antihistamines 12 hours prior to your test.  On the Day of the Test: Drink plenty of water until 1 hour prior to the test. Do not eat any food 1 hour prior to test. You may take your regular medications prior to the test.  Take metoprolol (Lopressor) two hours prior to test. If you take Furosemide/Hydrochlorothiazide/Spironolactone, please HOLD on the morning of the test.  After the Test: Drink plenty of water. After receiving IV contrast, you may experience a mild flushed feeling. This is normal. On occasion, you may experience a mild rash up to 24 hours after the test. This is not dangerous. If this occurs, you can take Benadryl 25 mg and increase your fluid intake. If you experience trouble breathing, this can be serious. If it is severe call 911 IMMEDIATELY. If it is mild, please call our office. If you take any of these medications: Glipizide/Metformin, Avandament, Glucavance, please do not take 48 hours after completing test unless otherwise instructed.  We will call to schedule your test 2-4 weeks out understanding that some insurance companies will need an authorization prior to the service being performed.   For more information and frequently asked questions, please visit our website : [email or web address]  For non-scheduling related questions, please contact the cardiac imaging nurse navigator should you have any questions/concerns: Rockwell Alexandria, Cardiac Imaging Nurse Navigator Larey Brick, Cardiac Imaging Nurse Navigator [place] [facility] Direct Office Dial: [phone]   For scheduling needs, including cancellations and rescheduling, please call  [place], [phone].    Follow-Up: At [facility], you and your health needs are our  priority.  As part of our continuing mission to provide you with exceptional heart care, we have created designated Provider Care Teams.  These Care Teams include your primary Cardiologist (physician) and Advanced Practice Providers (APPs -  Physician Assistants and Nurse Practitioners) who all work together to provide you with the care you need, when you need it.  We recommend signing up for the patient portal called "MyChart".  Sign up information is provided on this After Visit Summary.  MyChart is used to connect with patients for Virtual Visits (Telemedicine).  Patients are able to view lab/test results, encounter notes, upcoming appointments, etc.  Non-urgent messages can be sent to your provider as well.   To learn more about what you can do with MyChart, go to [email or web address].    Your next appointment:   6 - 8 week(s)  Provider:   You may see Debbe Odea, MD or one of the following Advanced Practice Providers on your designated Care Team:   Nicolasa Ducking, NP Eula Listen, PA-C Cadence Fransico Kolbe, PA-C Charlsie Quest, NP    Signed, Debbe Odea, MD  03/29/2023 1:34 PM    [facility]

## 2023-03-29 NOTE — Patient Instructions (Signed)
Medication Instructions:   START Amlodipine - Take one tablet ( 5mg ) by mouth daily.   *If you need a refill on your cardiac medications before your next appointment, please call your pharmacy*   Lab Work:  None labs.   If you have labs (blood work) drawn today and your tests are completely normal, you will receive your results only by: MyChart Message (if you have MyChart) OR A paper copy in the mail If you have any lab test that is abnormal or we need to change your treatment, we will call you to review the results.   Testing/Procedures:    Your cardiac CT will be scheduled at one of the below locations:   [facility] [address] [place], [place] [address] [phone]  OR   [facility] [address] [place], [place] [address] [phone]     If scheduled at [facility] or [facility], please arrive 15 mins early for check-in and test prep. There is spacious parking and easy access to the radiology department from the [facility] entrance. Please enter here and check-in with the desk attendant.    Please follow these instructions carefully (unless otherwise directed):  An IV will be required for this test and Nitroglycerin will be given.  Hold all erectile dysfunction medications at least 3 days (72 hrs) prior to test. (Ie viagra, cialis, sildenafil, tadalafil, etc)   On the Night Before the Test: Be sure to Drink plenty of water. Do not consume any caffeinated/decaffeinated beverages or chocolate 12 hours prior to your test. Do not take any antihistamines 12 hours prior to your test.  On the Day of the Test: Drink plenty of water until 1 hour prior to the test. Do not eat any food 1 hour prior to test. You may take your regular medications prior to the test.  Take metoprolol (Lopressor) two hours prior to test. If you take  Furosemide/Hydrochlorothiazide/Spironolactone, please HOLD on the morning of the test.  After the Test: Drink plenty of water. After receiving IV contrast, you may experience a mild flushed feeling. This is normal. On occasion, you may experience a mild rash up to 24 hours after the test. This is not dangerous. If this occurs, you can take Benadryl 25 mg and increase your fluid intake. If you experience trouble breathing, this can be serious. If it is severe call 911 IMMEDIATELY. If it is mild, please call our office. If you take any of these medications: Glipizide/Metformin, Avandament, Glucavance, please do not take 48 hours after completing test unless otherwise instructed.  We will call to schedule your test 2-4 weeks out understanding that some insurance companies will need an authorization prior to the service being performed.   For more information and frequently asked questions, please visit our website : [email or web address]  For non-scheduling related questions, please contact the cardiac imaging nurse navigator should you have any questions/concerns: Rockwell Alexandria, Cardiac Imaging Nurse Navigator Larey Brick, Cardiac Imaging Nurse Navigator [place] [facility] Direct Office Dial: [phone]   For scheduling needs, including cancellations and rescheduling, please call [place], [phone].    Follow-Up: At [facility], you and your health needs are our priority.  As part of our continuing mission to provide you with exceptional heart care, we have created designated Provider Care Teams.  These Care Teams include your primary Cardiologist (physician) and Advanced Practice Providers (APPs -  Physician  Assistants and Nurse Practitioners) who all work together to provide you with the care you need, when you need it.  We recommend signing up for the patient portal called "MyChart".  Sign up information is provided on this After Visit  Summary.  MyChart is used to connect with patients for Virtual Visits (Telemedicine).  Patients are able to view lab/test results, encounter notes, upcoming appointments, etc.  Non-urgent messages can be sent to your provider as well.   To learn more about what you can do with MyChart, go to [email or web address].    Your next appointment:   6 - 8 week(s)  Provider:   You may see Debbe Odea, MD or one of the following Advanced Practice Providers on your designated Care Team:   Nicolasa Ducking, NP Eula Listen, PA-C Cadence Fransico Derryl, PA-C Charlsie Quest, NP

## 2023-04-03 ENCOUNTER — Other Ambulatory Visit: Payer: Self-pay

## 2023-04-12 ENCOUNTER — Ambulatory Visit: Payer: BC Managed Care – PPO | Attending: Nurse Practitioner | Admitting: Physical Therapy

## 2023-05-08 ENCOUNTER — Ambulatory Visit: Payer: BC Managed Care – PPO | Admitting: Family Medicine

## 2023-05-08 ENCOUNTER — Encounter: Payer: Self-pay | Admitting: Family Medicine

## 2023-05-08 VITALS — BP 148/97 | HR 77 | Ht 64.0 in | Wt 158.7 lb

## 2023-05-08 DIAGNOSIS — F411 Generalized anxiety disorder: Secondary | ICD-10-CM | POA: Insufficient documentation

## 2023-05-08 DIAGNOSIS — I10 Essential (primary) hypertension: Secondary | ICD-10-CM | POA: Diagnosis not present

## 2023-05-08 DIAGNOSIS — Z8673 Personal history of transient ischemic attack (TIA), and cerebral infarction without residual deficits: Secondary | ICD-10-CM | POA: Diagnosis not present

## 2023-05-08 DIAGNOSIS — A049 Bacterial intestinal infection, unspecified: Secondary | ICD-10-CM | POA: Diagnosis not present

## 2023-05-08 DIAGNOSIS — F1010 Alcohol abuse, uncomplicated: Secondary | ICD-10-CM

## 2023-05-08 DIAGNOSIS — I6930 Unspecified sequelae of cerebral infarction: Secondary | ICD-10-CM | POA: Insufficient documentation

## 2023-05-08 MED ORDER — AMLODIPINE BESYLATE 10 MG PO TABS
10.0000 mg | ORAL_TABLET | Freq: Every day | ORAL | 0 refills | Status: DC
Start: 2023-05-08 — End: 2023-05-31

## 2023-05-08 MED ORDER — BENAZEPRIL HCL 10 MG PO TABS: 10.0000 mg | ORAL_TABLET | Freq: Every day | ORAL | 0 refills | Status: DC

## 2023-05-08 MED ORDER — ATORVASTATIN CALCIUM 40 MG PO TABS
40.0000 mg | ORAL_TABLET | Freq: Every day | ORAL | 3 refills | Status: DC
Start: 2023-05-08 — End: 2023-05-31

## 2023-05-08 MED ORDER — CIPROFLOXACIN HCL 500 MG PO TABS
500.0000 mg | ORAL_TABLET | Freq: Two times a day (BID) | ORAL | 0 refills | Status: AC
Start: 2023-05-08 — End: 2023-05-11

## 2023-05-08 MED ORDER — BUSPIRONE HCL 5 MG PO TABS
5.0000 mg | ORAL_TABLET | Freq: Three times a day (TID) | ORAL | 0 refills | Status: DC | PRN
Start: 2023-05-08 — End: 2023-05-31

## 2023-05-08 NOTE — Assessment & Plan Note (Signed)
Acute on chronic, GI symptoms could be iso of ETOH binge like behavior Pt reports increased stressors following relationship concerns with his fiancee  Continue to monitor use; goal of 2 standard drinks/day max

## 2023-05-08 NOTE — Assessment & Plan Note (Signed)
Recommend BP control and LP control Goal of 119/79 and LDL of <70 Continue to monitor risk of stroke iso EtOH and tobacco use/dependency

## 2023-05-08 NOTE — Assessment & Plan Note (Signed)
Chronic; uncontrolled Currently use of EtOH to assist with mood symptoms Trial of buspar to assist F/u as needed

## 2023-05-08 NOTE — Progress Notes (Signed)
Established patient visit   Patient: Eddie Evans   DOB: 05/25/1996   27 y.o. Male  MRN: [id number] Visit Date: 05/08/2023  Today's healthcare provider: Jacky Kindle, FNP  Introduced to nurse practitioner role and practice setting.  All questions answered.  Discussed provider/patient relationship and expectations.  Subjective    HPI HPI     Diarrhea    Additional comments: Present 3 days and constant. Associated with emesis and nausea.       Last edited by Acey Lav, CMA on 05/08/2023 11:17 AM.      Medications: Outpatient Medications Prior to Visit  Medication Sig   [DISCONTINUED] benazepril (LOTENSIN) 10 MG tablet Take 1 tablet (10 mg total) by mouth daily. Please schedule office visit for follow up.   metoprolol tartrate (LOPRESSOR) 100 MG tablet Take 1 tablet (100 mg total) by mouth once for 1 dose. TWO HOURS PRIOR TO CARDIAC CTA   [START ON 09/23/2023] nicotine (NICODERM CQ - DOSED IN MG/24 HOURS) 21 mg/24hr patch Place 1 patch (21 mg total) onto the skin daily for 14 days. (Patient not taking: Reported on 05/08/2023)   nicotine polacrilex (NICORETTE) 2 MG gum Take 1 each (2 mg total) by mouth as needed for smoking cessation. (Patient not taking: Reported on 05/08/2023)   [DISCONTINUED] amLODipine (NORVASC) 5 MG tablet Take 1 tablet (5 mg total) by mouth daily. (Patient not taking: Reported on 05/08/2023)   No facility-administered medications prior to visit.     Objective    BP (!) 148/97 (BP Location: Right Arm, Patient Position: Sitting, Cuff Size: Normal)   Pulse 77   Ht 5\' 4"  (1.626 m)   Wt 158 lb 11.2 oz (72 kg)   SpO2 100%   BMI 27.24 kg/m   Physical Exam Vitals and nursing note reviewed.  Constitutional:      Appearance: Normal appearance. He is overweight.  HENT:     Head: Normocephalic and atraumatic.  Cardiovascular:     Rate and Rhythm: Normal rate and regular rhythm.     Pulses: Normal pulses.     Heart sounds: Normal heart sounds.   Pulmonary:     Effort: Pulmonary effort is normal.     Breath sounds: Normal breath sounds.  Abdominal:     Comments: Hyperactive BS  Musculoskeletal:        General: Normal range of motion.     Cervical back: Normal range of motion.  Skin:    General: Skin is warm and dry.     Capillary Refill: Capillary refill takes less than 2 seconds.  Neurological:     General: No focal deficit present.     Mental Status: He is alert and oriented to person, place, and time. Mental status is at baseline.  Psychiatric:        Mood and Affect: Mood normal.        Behavior: Behavior normal.        Thought Content: Thought content normal.        Judgment: Judgment normal.    No results found for any visits on 05/08/23.  Assessment & Plan     Problem List Items Addressed This Visit       Cardiovascular and Mediastinum   Hypertension - Primary    Chronic, uncontrolled Pt was confused following cardiology and has been off norvasc 5 mg Has only been taking benazepril 10 mg Refills provided of both with titration of norvasc to 10 mg Pt encouraged to take 3  days of 1/2 tablet or 5 mg to assist with prevention of LE  BP goal of 120/80      Relevant Medications   amLODipine (NORVASC) 10 MG tablet   benazepril (LOTENSIN) 10 MG tablet   atorvastatin (LIPITOR) 40 MG tablet     Digestive   Bacterial infection of gastrointestinal tract    Ongoing GI complaints x 3 days with NVD; recommend ABX treatment Continue bland diet to assist and maintain hydration       Relevant Medications   ciprofloxacin (CIPRO) 500 MG tablet     Other   Alcohol consumption binge drinking    Acute on chronic, GI symptoms could be iso of ETOH binge like behavior Pt reports increased stressors following relationship concerns with his fiancee  Continue to monitor use; goal of 2 standard drinks/day max      GAD (generalized anxiety disorder)    Chronic; uncontrolled Currently use of EtOH to assist with mood  symptoms Trial of buspar to assist F/u as needed      Relevant Medications   busPIRone (BUSPAR) 5 MG tablet   History of CVA (cerebrovascular accident)    Recommend BP control and LP control Goal of 119/79 and LDL of <70 Continue to monitor risk of stroke iso EtOH and tobacco use/dependency       Relevant Medications   atorvastatin (LIPITOR) 40 MG tablet   RESOLVED: History of hemorrhagic cerebrovascular accident (CVA) with residual deficit   Return in about 4 weeks (around 06/05/2023) for chonic disease management.     Leilani Merl, FNP, have reviewed all documentation for this visit. The documentation on 05/08/23 for the exam, diagnosis, procedures, and orders are all accurate and complete.  Jacky Kindle, FNP  [facility] [phone] (phone) [phone] (fax)  [facility]

## 2023-05-08 NOTE — Assessment & Plan Note (Signed)
Chronic, uncontrolled Pt was confused following cardiology and has been off norvasc 5 mg Has only been taking benazepril 10 mg Refills provided of both with titration of norvasc to 10 mg Pt encouraged to take 3 days of 1/2 tablet or 5 mg to assist with prevention of LE  BP goal of 120/80

## 2023-05-08 NOTE — Assessment & Plan Note (Signed)
Ongoing GI complaints x 3 days with NVD; recommend ABX treatment Continue bland diet to assist and maintain hydration

## 2023-05-21 ENCOUNTER — Encounter (HOSPITAL_COMMUNITY): Payer: Self-pay

## 2023-05-22 ENCOUNTER — Telehealth (HOSPITAL_COMMUNITY): Payer: Self-pay | Admitting: *Deleted

## 2023-05-22 NOTE — Telephone Encounter (Signed)
Reaching out to patient to offer assistance regarding upcoming cardiac imaging study; pt verbalizes understanding of appt date/time, parking situation and where to check in, pre-test NPO status and medications ordered, and verified current allergies; name and call back number provided for further questions should they arise  Merle Prescott RN Navigator Cardiac Imaging [place] [facility] [phone] office [phone] cell  Patient to take 100mg metoprolol tartrate two hours prior to his cardiac CT scan. 

## 2023-05-23 ENCOUNTER — Ambulatory Visit
Admission: RE | Admit: 2023-05-23 | Discharge: 2023-05-23 | Disposition: A | Discharge status: Home / Self Care | Payer: BC Managed Care – PPO | Source: Ambulatory Visit | Attending: Cardiology | Admitting: Cardiology

## 2023-05-23 DIAGNOSIS — R072 Precordial pain: Secondary | ICD-10-CM | POA: Insufficient documentation

## 2023-05-23 MED ORDER — IOHEXOL 350 MG/ML SOLN
75.0000 mL | Freq: Once | INTRAVENOUS | Status: AC | PRN
  Administered 2023-05-23: 75 mL via INTRAVENOUS

## 2023-05-23 MED ORDER — NITROGLYCERIN 0.4 MG SL SUBL
0.8000 mg | SUBLINGUAL_TABLET | Freq: Once | SUBLINGUAL | Status: AC
  Administered 2023-05-23: 0.8 mg via SUBLINGUAL

## 2023-05-23 MED ORDER — SODIUM CHLORIDE 0.9 % IV SOLN: INTRAVENOUS | Status: DC

## 2023-05-23 MED ORDER — METOPROLOL TARTRATE 5 MG/5ML IV SOLN
10.0000 mg | Freq: Once | INTRAVENOUS | Status: AC | PRN
  Administered 2023-05-23: 10 mg via INTRAVENOUS

## 2023-05-23 NOTE — Progress Notes (Signed)
Patient tolerated CT well. Drank water after. Vital signs stable encourage to drink water throughout day.Reasons explained and verbalized understanding. Ambulated steady gait.  

## 2023-05-31 ENCOUNTER — Encounter: Payer: Self-pay | Admitting: Cardiology

## 2023-05-31 ENCOUNTER — Ambulatory Visit: Payer: BC Managed Care – PPO | Attending: Cardiology | Admitting: Cardiology

## 2023-05-31 ENCOUNTER — Other Ambulatory Visit: Payer: Self-pay

## 2023-05-31 VITALS — BP 156/112 | HR 77 | Wt 156.8 lb

## 2023-05-31 DIAGNOSIS — I10 Essential (primary) hypertension: Secondary | ICD-10-CM | POA: Diagnosis not present

## 2023-05-31 DIAGNOSIS — R072 Precordial pain: Secondary | ICD-10-CM | POA: Diagnosis not present

## 2023-05-31 MED ORDER — BENAZEPRIL HCL 40 MG PO TABS
40.0000 mg | ORAL_TABLET | Freq: Every day | ORAL | 3 refills | Status: DC
Start: 2023-05-31 — End: 2023-11-22

## 2023-05-31 NOTE — Patient Instructions (Signed)
Medication Instructions:   INCREASE Benazepril - Take one tablet ( 40mg ) by mouth daily.   *If you need a refill on your cardiac medications before your next appointment, please call your pharmacy*   Lab Work:  None Ordered  If you have labs (blood work) drawn today and your tests are completely normal, you will receive your results only by: MyChart Message (if you have MyChart) OR A paper copy in the mail If you have any lab test that is abnormal or we need to change your treatment, we will call you to review the results.   Testing/Procedures:  None Ordered    Follow-Up: At [facility], you and your health needs are our priority.  As part of our continuing mission to provide you with exceptional heart care, we have created designated Provider Care Teams.  These Care Teams include your primary Cardiologist (physician) and Advanced Practice Providers (APPs -  Physician Assistants and Nurse Practitioners) who all work together to provide you with the care you need, when you need it.  We recommend signing up for the patient portal called "MyChart".  Sign up information is provided on this After Visit Summary.  MyChart is used to connect with patients for Virtual Visits (Telemedicine).  Patients are able to view lab/test results, encounter notes, upcoming appointments, etc.  Non-urgent messages can be sent to your provider as well.   To learn more about what you can do with MyChart, go to [email or web address].    Your next appointment:   3 month(s)  Provider:   You may see Debbe Odea, MD or one of the following Advanced Practice Providers on your designated Care Team:   Nicolasa Ducking, NP Eula Listen, PA-C Cadence Fransico Kyree, PA-C Charlsie Quest, NP

## 2023-05-31 NOTE — Progress Notes (Signed)
Cardiology Office Note:    Date:  05/31/2023   ID:  Eddie Evans, DOB Apr 16, 1996, MRN [id number]  PCP:  Malva Limes, MD   [facility] Providers Cardiologist:  Debbe Odea, MD     Referring MD: Malva Limes, MD   Chief Complaint  Patient presents with   Follow-up    Discuss cardiac testing results.  Patient denies new or acute cardiac problems/concerns today.  Only taking Benazapril 10mg  1 every day due to not able to financial constraints.       History of Present Illness:    Eddie Evans is a 27 y.o. male with a hx of hypertension, current e-cigarette use, anxiety who presents for follow-up.  Previously seen due to chest pain.  Coronary CT was obtained to evaluate cardiac etiology.  Has been taking only benazepril 10 mg daily due to financial constraints.  Not able to afford amlodipine as previously prescribed.  Blood pressures have been elevated at home.  Otherwise feels well, no other concerns at this time.  Prior notes/studies Echo 08/2022 EF over 70%.  Past Medical History:  Diagnosis Date   Anxiety    Hypertension    Stroke [facility])     History reviewed. No pertinent surgical history.  Current Medications: Current Meds  Medication Sig   [DISCONTINUED] benazepril (LOTENSIN) 10 MG tablet Take 1 tablet (10 mg total) by mouth daily.     Allergies:   Tape   Social History   Socioeconomic History   Marital status: Single    Spouse name: Not on file   Number of children: Not on file   Years of education: Not on file   Highest education level: Not on file  Occupational History   Occupation: High school student    Comment: [employer]  Tobacco Use   Smoking status: Former    Current packs/day: 0.00    Types: Cigarettes, E-cigarettes    Quit date: 08/2022    Years since quitting: 0.7   Smokeless tobacco: Former    Types: Snuff, Chew   Tobacco comments:    1 pack every 2 weeks  Vaping Use   Vaping status: Every Day   Substance and Sexual Activity   Alcohol use: Yes    Comment: daily   Drug use: Yes    Types: Marijuana   Sexual activity: Not on file  Other Topics Concern   Not on file  Social History Narrative   Not on file   Social Determinants of Health   Financial Resource Strain: Medium Risk (09/21/2022)   Overall Financial Resource Strain (CARDIA)    Difficulty of Paying Living Expenses: Somewhat hard  Food Insecurity: No Food Insecurity (09/21/2022)   Hunger Vital Sign    Worried About Running Out of Food in the Last Year: Never true    Ran Out of Food in the Last Year: Never true  Transportation Needs: Unmet Transportation Needs (09/11/2022)   PRAPARE - Administrator, Civil Service (Medical): Yes    Lack of Transportation (Non-Medical): Yes  Physical Activity: Not on file  Stress: Not on file  Social Connections: Not on file     Family History: The patient's family history includes Diabetes in his mother; Hyperlipidemia in his mother; Hypertension in his mother.  ROS:   Please see the history of present illness.     All other systems reviewed and are negative.  EKGs/Labs/Other Studies Reviewed:    The following studies were reviewed  today:       Recent Labs: 09/05/2022: ALT 27 01/15/2023: BUN 11; Creatinine, Ser 0.72; Hemoglobin 15.5; Platelets 278; Potassium 3.3; Sodium 136  Recent Lipid Panel    Component Value Date/Time   CHOL 155 09/06/2022 0450   CHOL 163 08/16/2020 1636   TRIG 134 09/06/2022 0450   HDL 48 09/06/2022 0450   HDL 57 08/16/2020 1636   CHOLHDL 3.2 09/06/2022 0450   VLDL 27 09/06/2022 0450   LDLCALC 80 09/06/2022 0450   LDLCALC 75 08/16/2020 1636     Risk Assessment/Calculations:     HYPERTENSION CONTROL Vitals:   05/31/23 1425 05/31/23 1432  BP: (!) 154/102 (!) 156/112    The patient's blood pressure is elevated above target today.  In order to address the patient's elevated BP: A current anti-hypertensive medication was  adjusted today.        Physical Exam:    VS:  BP (!) 156/112 (BP Location: Left Arm, Patient Position: Sitting, Cuff Size: Normal)   Pulse 77   Wt 156 lb 12.8 oz (71.1 kg)   SpO2 98%   BMI 26.91 kg/m     Wt Readings from Last 3 Encounters:  05/31/23 156 lb 12.8 oz (71.1 kg)  05/08/23 158 lb 11.2 oz (72 kg)  03/29/23 155 lb 12.8 oz (70.7 kg)     GEN:  Well nourished, well developed in no acute distress HEENT: Normal NECK: No JVD; No carotid bruits CARDIAC: RRR, no murmurs, rubs, gallops RESPIRATORY:  Clear to auscultation without rales, wheezing or rhonchi  ABDOMEN: Soft, non-tender, non-distended MUSCULOSKELETAL:  No edema; No deformity  SKIN: Warm and dry NEUROLOGIC:  Alert and oriented x 3 PSYCHIATRIC:  Normal affect   ASSESSMENT:    1. Precordial pain   2. Primary hypertension    PLAN:    In order of problems listed above:  Chest pain, coronary CT with no CAD.  Echo 12/23 with normal EF.  Symptoms likely from stress/anxiety.   Hypertension, BP elevated, increase benazepril to 40 mg daily.  Follow-up in 3 months.     Medication Adjustments/Labs and Tests Ordered: Current medicines are reviewed at length with the patient today.  Concerns regarding medicines are outlined above.  No orders of the defined types were placed in this encounter.  Meds ordered this encounter  Medications   benazepril (LOTENSIN) 40 MG tablet    Sig: Take 1 tablet (40 mg total) by mouth daily.    Dispense:  30 tablet    Refill:  3    Patient Instructions  Medication Instructions:   INCREASE Benazepril - Take one tablet ( 40mg ) by mouth daily.   *If you need a refill on your cardiac medications before your next appointment, please call your pharmacy*   Lab Work:  None Ordered  If you have labs (blood work) drawn today and your tests are completely normal, you will receive your results only by: MyChart Message (if you have MyChart) OR A paper copy in the mail If you have  any lab test that is abnormal or we need to change your treatment, we will call you to review the results.   Testing/Procedures:  None Ordered    Follow-Up: At [facility], you and your health needs are our priority.  As part of our continuing mission to provide you with exceptional heart care, we have created designated Provider Care Teams.  These Care Teams include your primary Cardiologist (physician) and Advanced Practice Providers (APPs -  Physician Assistants  and Nurse Practitioners) who all work together to provide you with the care you need, when you need it.  We recommend signing up for the patient portal called "MyChart".  Sign up information is provided on this After Visit Summary.  MyChart is used to connect with patients for Virtual Visits (Telemedicine).  Patients are able to view lab/test results, encounter notes, upcoming appointments, etc.  Non-urgent messages can be sent to your provider as well.   To learn more about what you can do with MyChart, go to [email or web address].    Your next appointment:   3 month(s)  Provider:   You may see Debbe Odea, MD or one of the following Advanced Practice Providers on your designated Care Team:   Nicolasa Ducking, NP Eula Listen, PA-C Cadence Fransico Elizabeth, PA-C Charlsie Quest, NP   Signed, Debbe Odea, MD  05/31/2023 4:09 PM    [facility]

## 2023-06-04 ENCOUNTER — Other Ambulatory Visit: Payer: Self-pay | Admitting: Family Medicine

## 2023-06-04 DIAGNOSIS — F411 Generalized anxiety disorder: Secondary | ICD-10-CM

## 2023-07-11 ENCOUNTER — Other Ambulatory Visit: Payer: Self-pay

## 2023-07-11 ENCOUNTER — Ambulatory Visit: Payer: Self-pay | Admitting: *Deleted

## 2023-07-11 ENCOUNTER — Encounter: Payer: Self-pay | Admitting: Emergency Medicine

## 2023-07-11 ENCOUNTER — Emergency Department
Admission: EM | Admit: 2023-07-11 | Discharge: 2023-07-11 | Disposition: A | Discharge status: Home / Self Care | Payer: Self-pay | Attending: Emergency Medicine | Admitting: Emergency Medicine

## 2023-07-11 ENCOUNTER — Emergency Department: Payer: Self-pay

## 2023-07-11 DIAGNOSIS — I10 Essential (primary) hypertension: Secondary | ICD-10-CM | POA: Insufficient documentation

## 2023-07-11 LAB — BASIC METABOLIC PANEL
Anion gap: 10 (ref 5–15)
BUN: 14 mg/dL (ref 6–20)
CO2: 24 mmol/L (ref 22–32)
Calcium: 9.8 mg/dL (ref 8.9–10.3)
Chloride: 101 mmol/L (ref 98–111)
Creatinine, Ser: 0.68 mg/dL (ref 0.61–1.24)
GFR, Estimated: >60 mL/min (ref >60)
Glucose, Bld: 109 mg/dL — ABNORMAL HIGH (ref 70–99)
Potassium: 3.6 mmol/L (ref 3.5–5.1)
Sodium: 135 mmol/L (ref 135–145)

## 2023-07-11 LAB — CBC
HCT: 44.3 % (ref 39.0–52.0)
Hemoglobin: 16.1 g/dL (ref 13.0–17.0)
MCH: 30.5 pg (ref 26.0–34.0)
MCHC: 36.3 g/dL — ABNORMAL HIGH (ref 30.0–36.0)
MCV: 83.9 fL (ref 80.0–100.0)
Platelets: 295 10*3/uL (ref 150–400)
RBC: 5.28 MIL/uL (ref 4.22–5.81)
RDW: 12.7 % (ref 11.5–15.5)
WBC: 10 10*3/uL (ref 4.0–10.5)
nRBC: 0 % (ref 0.0–0.2)

## 2023-07-11 MED ORDER — BENAZEPRIL HCL 20 MG PO TABS
40.0000 mg | ORAL_TABLET | Freq: Every day | ORAL | Status: DC
  Administered 2023-07-11: 40 mg via ORAL
  Filled 2023-07-11: qty 2

## 2023-07-11 MED ORDER — BENAZEPRIL HCL 40 MG PO TABS
40.0000 mg | ORAL_TABLET | Freq: Every day | ORAL | 2 refills | Status: DC
  Filled 2023-07-11: qty 30, 30d supply, fill #0
  Filled 2023-08-30: qty 30, 30d supply, fill #1
  Filled 2023-10-04: qty 30, 30d supply, fill #2

## 2023-07-11 NOTE — Telephone Encounter (Signed)
  Chief Complaint: Vomited once this morning Symptoms: Having diarrhea for the last 3 days and cold chills.   "I feel like I might have the flu, maybe".   I don't feel good. Frequency: Vomited once this morning.  Took Tums.   I suggested he take Pepto Bismol which he was agreeable to getting some. Pertinent Negatives: Patient denies sore throat, runny nose, cough, fever Disposition: [] ED /[] Urgent Care (no appt availability in office) / [] Appointment(In office/virtual)/ []  [facility]/ [x] Home Care/ [] Refused Recommended Disposition /[] [facility]/ []  Follow-up with PCP Additional Notes: Pt was agreeable to trying the Pepto Bismol for a couple of days and if that did not help to call us back.   He mentioned not having the money to come in.   Unable to pay the co pay.   Been out of his BP medication for a month due to financial constraints.   He just started a new job with insurance so when he gets paid he will restart the BP medication.

## 2023-07-11 NOTE — ED Notes (Signed)
Reports he has been out of his medication for a few weeks

## 2023-07-11 NOTE — ED Provider Notes (Signed)
[facility] Provider Note    Event Date/Time   First MD Initiated Contact with Patient 07/11/23 1104     (approximate)   History   Headache and Hypertension   HPI  Eddie Evans is a 27 y.o. male with a past medical history of intraparenchymal hemorrhage and CVA in December 2023 due to hypertension, hypertension, generalized anxiety disorder who presents today with request for antihypertensives.  Patient reports that he has been off of his meds for approximately 2 weeks.  He reports that he lost his job and therefore he has not picked up his meds.  He thinks that there is more at the pharmacy, though he was told to get his medications sent to a different pharmacy which might be cheaper.  He reports that he has had intermittent headaches throughout the course of the week, but denies feeling any symptoms currently.  He denies headache now, paresthesias, weakness, visual changes, chest pain, shortness of breath, difficulty walking or speaking.  He reports that he feels completely normal currently.  Patient Active Problem List   Diagnosis Date Noted   Bacterial infection of gastrointestinal tract 05/08/2023   GAD (generalized anxiety disorder) 05/08/2023   Alcohol consumption binge drinking 05/08/2023   History of CVA (cerebrovascular accident) 09/12/2022   Hypertension 04/18/2015          Physical Exam   Triage Vital Signs: ED Triage Vitals  Encounter Vitals Group     BP 07/11/23 0933 (!) 153/95     Systolic BP Percentile --      Diastolic BP Percentile --      Pulse Rate 07/11/23 0933 (!) 103     Resp 07/11/23 0933 18     Temp 07/11/23 0933 98.5 F (36.9 C)     Temp Source 07/11/23 0933 Oral     SpO2 07/11/23 0933 99 %     Weight 07/11/23 0926 190 lb (86.2 kg)     Height 07/11/23 0926 5\' 6"  (1.676 m)     Head Circumference --      Peak Flow --      Pain Score 07/11/23 0926 0     Pain Loc --      Pain Education --      Exclude from Growth  Chart --     Most recent vital signs: Vitals:   07/11/23 1147 07/11/23 1317  BP: (!) 171/102 (!) 144/92  Pulse: 91 91  Resp: 16 16  Temp:    SpO2: 99% 96%    Physical Exam Vitals and nursing note reviewed.  Constitutional:      General: Awake and alert. No acute distress.    Appearance: Normal appearance. The patient is normal weight.  HENT:     Head: Normocephalic and atraumatic.     Mouth: Mucous membranes are moist.  Eyes:     General: PERRL. Normal EOMs        Right eye: No discharge.        Left eye: No discharge.     Conjunctiva/sclera: Conjunctivae normal.  Cardiovascular:     Rate and Rhythm: Normal rate and regular rhythm.     Pulses: Normal pulses.  Pulmonary:     Effort: Pulmonary effort is normal. No respiratory distress.     Breath sounds: Normal breath sounds.  Abdominal:     Abdomen is soft. There is no abdominal tenderness. No rebound or guarding. No distention. Musculoskeletal:        General: No swelling.  Normal range of motion.     Cervical back: Normal range of motion and neck supple.  Skin:    General: Skin is warm and dry.     Capillary Refill: Capillary refill takes less than 2 seconds.     Findings: No rash.  Neurological:     Mental Status: The patient is awake and alert.   Neurological: GCS 15 alert and oriented x3 Normal speech, no expressive or receptive aphasia or dysarthria Cranial nerves II through XII intact Normal visual fields 5 out of 5 strength in all 4 extremities with intact sensation throughout No extremity drift Normal finger-to-nose testing, no limb or truncal ataxia    ED Results / Procedures / Treatments   Labs (all labs ordered are listed, but only abnormal results are displayed) Labs Reviewed  CBC - Abnormal; Notable for the following components:      Result Value   MCHC 36.3 (*)    All other components within normal limits  BASIC METABOLIC PANEL - Abnormal; Notable for the following components:   Glucose,  Bld 109 (*)    All other components within normal limits     EKG     RADIOLOGY I independently reviewed and interpreted imaging and agree with radiologists findings.     PROCEDURES:  Critical Care performed:   Procedures   MEDICATIONS ORDERED IN ED: Medications  benazepril (LOTENSIN) tablet 40 mg (40 mg Oral Given 07/11/23 1147)     IMPRESSION / MDM / ASSESSMENT AND PLAN / ED COURSE  I reviewed the triage vital signs and the nursing notes.   Differential diagnosis includes, but is not limited to, uncontrolled hypertension, hypertensive emergency, hypertensive urgency, medical noncompliance.  I reviewed the patient's chart.  Patient was hospitalized in December 2023 for nontraumatic intraparenchymal hemorrhage and CVA attributed to uncontrolled hypertension at which time he had not been taking his antihypertensives for 5 weeks prior to CVA, with residual right-sided deficits which improved with PT.  He also had an appointment with his cardiologist on 05/31/2023 at which time his benazepril was increased from 20 mg to 40 mg.  Patient is awake and alert, neurovascularly intact.  He is hypertensive to 153/95 which appears to be similar to his baseline per chart review.  He has no focal neurological deficits.  Labs obtained in triage are overall reassuring.  However, given his history of intracranial hemorrhage secondary to hypertension and his intermittent headaches, will obtain head CT.  He currently does not have any symptoms or complaints and feels normal.  CT scan is normal.  He was given a dose of his antihypertensive in the emergency department with improvement of his blood pressure.  More was sent to his pharmacy of choice.  We discussed the importance of taking this medication daily.  Recommended close outpatient follow-up and strict return precautions.  Patient understands and agrees with plan.  He was discharged in stable condition.  Patient's presentation is most  consistent with acute presentation with potential threat to life or bodily function.    FINAL CLINICAL IMPRESSION(S) / ED DIAGNOSES   Final diagnoses:  Hypertension, unspecified type     Rx / DC Orders   ED Discharge Orders          Ordered    benazepril (LOTENSIN) 40 MG tablet  Daily        07/11/23 1302             Note:  This document was prepared using Dragon voice recognition software and  may include unintentional dictation errors.   Jackelyn Hoehn, PA-C 07/11/23 1753    Corena Herter, MD 07/13/23 1406

## 2023-07-11 NOTE — Telephone Encounter (Signed)
Reason for Disposition  MILD vomiting with diarrhea  Answer Assessment - Initial Assessment Questions 1. BLOOD PRESSURE: "What is the blood pressure?" "Did you take at least two measurements 5 minutes apart?"     BP is elevated and he vomited once this morning.   Not feeling well.  I have not checked my BP yet.   I vomited a few minutes ago.   Once.     I ate this morning and it didn't settle well with me.    I took Tums.     I have diarrhea for 3 days.   Cold chills.   I feel like I have flu.   I don't have insurance so I don't have any BP medicine right now.  4 weeks.    I don't have the money to pay the co pay.      Switched over to the vomiting protocol since he has not checked his BP.  The vomiting is the issue.     2. ONSET: "When did you take your blood pressure?"     I have not checked it this morning.    I started a new job recently.   3. HOW: "How did you take your blood pressure?" (e.g., automatic home BP monitor, visiting nurse)     Not taken. 4. HISTORY: "Do you have a history of high blood pressure?"     Yes 5. MEDICINES: "Are you taking any medicines for blood pressure?" "Have you missed any doses recently?"     No   See above 6. OTHER SYMPTOMS: "Do you have any symptoms?" (e.g., blurred vision, chest pain, difficulty breathing, headache, weakness)     I vomited once this morning.   I'm having cold chills and diarrhea. 7. PREGNANCY: "Is there any chance you are pregnant?" "When was your last menstrual period?"     N/A See vomiting protocol.  Answer Assessment - Initial Assessment Questions 1. VOMITING SEVERITY: "How many times have you vomited in the past 24 hours?"     - MILD:  1 - 2 times/day    - MODERATE: 3 - 5 times/day, decreased oral intake without significant weight loss or symptoms of dehydration    - SEVERE: 6 or more times/day, vomits everything or nearly everything, with significant weight loss, symptoms of dehydration      Pt called in c/o having vomited  once this morning.   Been having diarrhea for 3 days now.   Having cold chills.   "I feel like I might have the flu, maybe".   "I don't feel good". 2. ONSET: "When did the vomiting begin?"      Happened once this morning. 3. FLUIDS: "What fluids or food have you vomited up today?" "Have you been able to keep any fluids down?"     I ate breakfast earlier and I don't think it settled very well.   4. ABDOMEN PAIN: "Are your having any abdomen pain?" If Yes : "How bad is it and what does it feel like?" (e.g., crampy, dull, intermittent, constant)      No 5. DIARRHEA: "Is there any diarrhea?" If Yes, ask: "How many times today?"      Yes for the last 3 days. 6. CONTACTS: "Is there anyone else in the family with the same symptoms?"      Not asked 7. CAUSE: "What do you think is causing your vomiting?"     "I think I might have the flu" 8. HYDRATION STATUS: "Any signs of  dehydration?" (e.g., dry mouth [not only dry lips], too weak to stand) "When did you last urinate?"     No 9. OTHER SYMPTOMS: "Do you have any other symptoms?" (e.g., fever, headache, vertigo, vomiting blood or coffee grounds, recent head injury)     Cold chills.    I've been off my BP medications for a month because I just started a new job and don't have the money to buy my medicine yet.   I just got insurance.   I don't have the money to pay the co pay to come in. I suggested as soon as he could to get back on his BP medications.   He said he did have an rx for his medication and once he is paid he will get it.  10. PREGNANCY: "Is there any chance you are pregnant?" "When was your last menstrual period?"       N/A  Protocols used: Blood Pressure - High-A-AH, Vomiting-A-AH

## 2023-07-11 NOTE — ED Triage Notes (Signed)
Pt via POV from home. Pt has been having intermittent headache and numbness for the past couple of days. States that he has a hx of HTN and has been out of his medication for 2 weeks. Pt is a poor historian and dose not know what medication he takes. Denies any pain or numbness at this time. Pt does have a hx of a stroke in December 2023. Pt is A&Ox4 and NAD, ambulatory to triage.

## 2023-07-11 NOTE — Discharge Instructions (Signed)
It is very important that you take your blood pressure medicine as prescribed.  Please return to the emergency department for any new, worsening, or change in symptoms or other concerns.  It was a pleasure caring for you today.

## 2023-08-30 ENCOUNTER — Other Ambulatory Visit: Payer: Self-pay

## 2023-08-30 ENCOUNTER — Ambulatory Visit: Payer: BC Managed Care – PPO | Admitting: Cardiology

## 2023-09-03 ENCOUNTER — Ambulatory Visit: Payer: Self-pay | Attending: Cardiology | Admitting: Cardiology

## 2023-09-03 NOTE — Progress Notes (Deleted)
  Cardiology Office Note:  .   Date:  09/03/2023  ID:  Eddie Evans, DOB 1995/12/05, MRN [id number] PCP: Malva Limes, MD  [facility] Providers Cardiologist:  Debbe Odea, MD { Click to update primary MD,subspecialty MD or APP then REFRESH:1}   History of Present Illness: Marland Kitchen   Eddie Evans is a 27 y.o. male with a past medical history of hypertension, current e-cigarette use, anxiety, history of binge drinking, history of CVA, who is here today for follow-up.  He was last seen in clinic 05/31/2023 with complaints of chest pain.  He underwent coronary CTA that revealed no coronary artery disease and an echocardiogram from 08/2022 revealed a normal EF.  Symptoms were considered to be likely from stress/anxiety.  Blood pressure was elevated and his benazepril was increased to 40 mg daily.  He returns to clinic today  ROS: 10 point review of system has been reviewed and considered negative except for what is been listed in the HPI  Studies Reviewed: Marland Kitchen       Coronary CTA 05/23/2023 IMPRESSION: 1. Coronary calcium score of 0.   2. Normal coronary origin with right dominance.   3. No evidence of CAD.   4. CAD-RADS 0. Consider non-atherosclerotic causes of chest pain.   2D echo 09/05/2022 1. Left ventricular ejection fraction, by estimation, is >75%. The left  ventricle has hyperdynamic function. The left ventricle has no regional  wall motion abnormalities. Left ventricular diastolic parameters are  consistent with Grade I diastolic  dysfunction (impaired relaxation).   2. Right ventricular systolic function is low normal. The right  ventricular size is normal.   3. The mitral valve is grossly normal. Trivial mitral valve  regurgitation.   4. The aortic valve is tricuspid. Aortic valve regurgitation is not  visualized.   5. The inferior vena cava is normal in size with greater than 50%  respiratory variability, suggesting right atrial pressure of 3 mmHg.    Risk Assessment/Calculations:     No BP recorded.  {Refresh Note OR Click here to enter BP  :1}***       Physical Exam:   VS:  There were no vitals taken for this visit.   Wt Readings from Last 3 Encounters:  07/11/23 190 lb (86.2 kg)  05/31/23 156 lb 12.8 oz (71.1 kg)  05/08/23 158 lb 11.2 oz (72 kg)    GEN: Well nourished, well developed in no acute distress NECK: No JVD; No carotid bruits CARDIAC: ***RRR, no murmurs, rubs, gallops RESPIRATORY:  Clear to auscultation without rales, wheezing or rhonchi  ABDOMEN: Soft, non-tender, non-distended EXTREMITIES:  No edema; No deformity   ASSESSMENT AND PLAN: .   ***    {Are you ordering a CV Procedure (e.g. stress test, cath, DCCV, TEE, etc)?   Press F2        :[id number]}  Dispo: ***  Signed, Cassandra Harbold, NP

## 2023-10-04 ENCOUNTER — Other Ambulatory Visit: Payer: Self-pay

## 2023-11-19 ENCOUNTER — Other Ambulatory Visit: Payer: Self-pay | Admitting: Family Medicine

## 2023-11-19 NOTE — Telephone Encounter (Signed)
 Copied from CRM [id number]. Topic: Clinical - Medication Refill >> Nov 19, 2023  9:23 AM Gildardo Pounds wrote: Most Recent Primary Care Visit:  Provider: Merita Norton T  Department: [id number]  Visit Type: OFFICE VISIT  Date: 05/08/2023  Medication: benazepril (LOTENSIN) 40 MG tablet  Has the patient contacted their pharmacy? Yes (Agent: If no, request that the patient contact the pharmacy for the refill. If patient does not wish to contact the pharmacy document the reason why and proceed with request.) (Agent: If yes, when and what did the pharmacy advise?)  Is this the correct pharmacy for this prescription? Yes If no, delete pharmacy and type the correct one.  This is the patient's preferred pharmacy:    [facility] [address] [place] [address] Phone: [phone] Fax: [phone]   Has the prescription been filled recently? Yes  Is the patient out of the medication? Yes  Has the patient been seen for an appointment in the last year OR does the patient have an upcoming appointment? Yes  Can we respond through MyChart? Yes  Agent: Please be advised that Rx refills may take up to 3 business days. We ask that you follow-up with your pharmacy.

## 2023-11-20 ENCOUNTER — Other Ambulatory Visit: Payer: Self-pay

## 2023-11-20 NOTE — Telephone Encounter (Signed)
 Requested medication (s) are due for refill today: yes  Requested medication (s) are on the active medication list: yes  Last refill:  07/11/23  Future visit scheduled: no  Notes to clinic:  Unable to refill per protocol, last refill by ED provider. Routing to PCP for approval.     Requested Prescriptions  Pending Prescriptions Disp Refills   benazepril (LOTENSIN) 40 MG tablet 30 tablet 2    Sig: Take 1 tablet (40 mg total) by mouth daily.     Cardiovascular:  ACE Inhibitors Failed - 11/20/2023 11:14 AM      Failed - Last BP in normal range    BP Readings from Last 1 Encounters:  07/11/23 (!) 144/92         Failed - Valid encounter within last 6 months    Recent Outpatient Visits           6 months ago Primary hypertension   [facility] Jacky Kindle, FNP   10 months ago No-show for appointment   [facility] Merita Norton T, FNP   1 year ago History of hemorrhagic cerebrovascular accident (CVA) with residual deficit   [facility] Malva Limes, MD   1 year ago History of CVA (cerebrovascular accident)   [facility] Merita Norton T, FNP   3 years ago Primary hypertension   [place] [facility] Malva Limes, MD              Passed - Cr in normal range and within 180 days    Creatinine, Ser  Date Value Ref Range Status  07/11/2023 0.68 0.61 - 1.24 mg/dL Final         Passed - K in normal range and within 180 days    Potassium  Date Value Ref Range Status  07/11/2023 3.6 3.5 - 5.1 mmol/L Final         Passed - Patient is not pregnant

## 2023-11-21 ENCOUNTER — Encounter: Payer: Self-pay | Admitting: Emergency Medicine

## 2023-11-21 ENCOUNTER — Emergency Department
Admission: EM | Admit: 2023-11-21 | Discharge: 2023-11-22 | Disposition: A | Discharge status: Home / Self Care | Payer: Self-pay | Attending: Emergency Medicine | Admitting: Emergency Medicine

## 2023-11-21 ENCOUNTER — Emergency Department: Payer: Self-pay

## 2023-11-21 ENCOUNTER — Other Ambulatory Visit: Payer: Self-pay

## 2023-11-21 DIAGNOSIS — D72829 Elevated white blood cell count, unspecified: Secondary | ICD-10-CM | POA: Insufficient documentation

## 2023-11-21 DIAGNOSIS — I10 Essential (primary) hypertension: Secondary | ICD-10-CM | POA: Insufficient documentation

## 2023-11-21 DIAGNOSIS — N23 Unspecified renal colic: Secondary | ICD-10-CM | POA: Insufficient documentation

## 2023-11-21 LAB — COMPREHENSIVE METABOLIC PANEL
ALT: 20 U/L (ref 0–44)
AST: 22 U/L (ref 15–41)
Albumin: 4.9 g/dL (ref 3.5–5.0)
Alkaline Phosphatase: 74 U/L (ref 38–126)
Anion gap: 13 (ref 5–15)
BUN: 10 mg/dL (ref 6–20)
CO2: 22 mmol/L (ref 22–32)
Calcium: 9.6 mg/dL (ref 8.9–10.3)
Chloride: 102 mmol/L (ref 98–111)
Creatinine, Ser: 0.94 mg/dL (ref 0.61–1.24)
GFR, Estimated: >60 mL/min (ref >60)
Glucose, Bld: 124 mg/dL — ABNORMAL HIGH (ref 70–99)
Potassium: 3.6 mmol/L (ref 3.5–5.1)
Sodium: 137 mmol/L (ref 135–145)
Total Bilirubin: 0.7 mg/dL (ref 0.0–1.2)
Total Protein: 7.8 g/dL (ref 6.5–8.1)

## 2023-11-21 LAB — CBC
HCT: 44 % (ref 39.0–52.0)
Hemoglobin: 15.3 g/dL (ref 13.0–17.0)
MCH: 30.5 pg (ref 26.0–34.0)
MCHC: 34.8 g/dL (ref 30.0–36.0)
MCV: 87.6 fL (ref 80.0–100.0)
Platelets: 279 10*3/uL (ref 150–400)
RBC: 5.02 MIL/uL (ref 4.22–5.81)
RDW: 13 % (ref 11.5–15.5)
WBC: 18.2 10*3/uL — ABNORMAL HIGH (ref 4.0–10.5)
nRBC: 0 % (ref 0.0–0.2)

## 2023-11-21 LAB — URINALYSIS, ROUTINE W REFLEX MICROSCOPIC
Bacteria, UA: NONE SEEN
Bilirubin Urine: NEGATIVE
Glucose, UA: NEGATIVE mg/dL
Ketones, ur: NEGATIVE mg/dL
Leukocytes,Ua: NEGATIVE
Nitrite: NEGATIVE
Protein, ur: 100 mg/dL — AB
RBC / HPF: >50 RBC/hpf (ref 0–5)
Specific Gravity, Urine: 1.016 (ref 1.005–1.030)
Squamous Epithelial / HPF: 0 /HPF (ref 0–5)
pH: 6 (ref 5.0–8.0)

## 2023-11-21 LAB — LIPASE, BLOOD: Lipase: 38 U/L (ref 11–51)

## 2023-11-21 MED ORDER — IOHEXOL 300 MG/ML  SOLN
100.0000 mL | Freq: Once | INTRAMUSCULAR | Status: AC | PRN
Start: 2023-11-21 — End: 2023-11-21
  Administered 2023-11-21: 100 mL via INTRAVENOUS

## 2023-11-21 MED ORDER — SODIUM CHLORIDE 0.9 % IV BOLUS
1000.0000 mL | Freq: Once | INTRAVENOUS | Status: AC
  Administered 2023-11-21: 1000 mL via INTRAVENOUS

## 2023-11-21 MED ORDER — ONDANSETRON HCL 4 MG/2ML IJ SOLN
4.0000 mg | INTRAMUSCULAR | Status: AC
  Administered 2023-11-21: 4 mg via INTRAVENOUS
  Filled 2023-11-21: qty 2

## 2023-11-21 MED ORDER — MORPHINE SULFATE (PF) 4 MG/ML IV SOLN
4.0000 mg | Freq: Once | INTRAVENOUS | Status: AC
  Administered 2023-11-21: 4 mg via INTRAVENOUS
  Filled 2023-11-21: qty 1

## 2023-11-21 NOTE — ED Triage Notes (Signed)
 Pt via POV from home. Pt c/o RLQ pain, nausea, and possible constipation that started around 2:00pm.  Denies urinary symptoms. Denies any abd surgeries. Pt is A&Ox4 and NAD.

## 2023-11-21 NOTE — ED Provider Notes (Signed)
 [facility] Provider Note    Event Date/Time   First MD Initiated Contact with Patient 11/21/23 2051     (approximate)   History   Abdominal Pain   HPI  MILLARD BAUTCH is a 28 y.o. male who has been experiencing fairly severe right lower abdominal pain for a few hours.  He reports this started right after eating [employer].  Reports pain in his right lower abdomen nausea vomiting.  No groin pain no testicular pain  Denies previous history of surgeries.  Has had 1 previous stroke history of hypertension     Physical Exam   Triage Vital Signs: ED Triage Vitals  Encounter Vitals Group     BP 11/21/23 1847 (!) 179/101     Systolic BP Percentile --      Diastolic BP Percentile --      Pulse Rate 11/21/23 1847 69     Resp 11/21/23 1847 18     Temp 11/21/23 1847 98.1 F (36.7 C)     Temp Source 11/21/23 1847 Oral     SpO2 11/21/23 1847 98 %     Weight 11/21/23 1845 170 lb (77.1 kg)     Height 11/21/23 1845 5\' 4"  (1.626 m)     Head Circumference --      Peak Flow --      Pain Score 11/21/23 1845 9     Pain Loc --      Pain Education --      Exclude from Growth Chart --     Most recent vital signs: Vitals:   11/21/23 2338 11/21/23 2339  BP:    Pulse: 66 65  Resp: 20   Temp: 98.2 F (36.8 C)   SpO2: 100% 100%     General: Awake, no distress.  He does appear somewhat uncomfortable.  Mucous membranes slightly dry CV:  Good peripheral perfusion.  Normal tones Resp:  Normal effort.  Abd:  No distention.  Moderately tender along the right mid and right lower quadrant.  No noted pain epigastrium left upper left lower quadrant.  No right groin masses, normal scrotum without testicular tenderness Other:     ED Results / Procedures / Treatments   Labs (all labs ordered are listed, but only abnormal results are displayed) Labs Reviewed  COMPREHENSIVE METABOLIC PANEL - Abnormal; Notable for the following components:      Result Value    Glucose, Bld 124 (*)    All other components within normal limits  CBC - Abnormal; Notable for the following components:   WBC 18.2 (*)    All other components within normal limits  URINALYSIS, ROUTINE W REFLEX MICROSCOPIC - Abnormal; Notable for the following components:   Color, Urine YELLOW (*)    APPearance HAZY (*)    Hgb urine dipstick LARGE (*)    Protein, ur 100 (*)    All other components within normal limits  LIPASE, BLOOD     EKG     RADIOLOGY  CT scan of the abdomen and pelvis is pending.   PROCEDURES:  Critical Care performed: No  Procedures   MEDICATIONS ORDERED IN ED: Medications  morphine (PF) 4 MG/ML injection 4 mg (4 mg Intravenous Given 11/21/23 2147)  ondansetron (ZOFRAN) injection 4 mg (4 mg Intravenous Given 11/21/23 2147)  sodium chloride 0.9 % bolus 1,000 mL (0 mLs Intravenous Stopped 11/21/23 2318)  iohexol (OMNIPAQUE) 300 MG/ML solution 100 mL (100 mLs Intravenous Contrast Given 11/21/23 2154)  oxyCODONE-acetaminophen (PERCOCET/ROXICET)  5-325 MG per tablet 1 tablet (1 tablet Oral Given 11/22/23 0101)  tamsulosin (FLOMAX) capsule 0.4 mg (0.4 mg Oral Given 11/22/23 0101)  cephALEXin (KEFLEX) capsule 500 mg (500 mg Oral Given 11/22/23 0101)  benazepril (LOTENSIN) tablet 40 mg (40 mg Oral Given 11/22/23 0113)     IMPRESSION / MDM / ASSESSMENT AND PLAN / ED COURSE  I reviewed the triage vital signs and the nursing notes.                              Differential diagnosis includes but is not limited to, abdominal perforation, aortic dissection, cholecystitis, appendicitis, diverticulitis, colitis, esophagitis/gastritis, kidney stone, pyelonephritis, urinary tract infection, aortic aneurysm. All are considered in decision and treatment plan. Based upon the patient's presentation and risk factors, and given the abrupt onset of his symptoms as well as review of urinalysis that shows microscopic hematuria suspicious for possible nephrolithiasis however  considerations such as cholelithiasis cholecystitis appendicitis etc. are also given.  He does not have any clear infectious symptoms such as dysuria fevers chills etc. but does not have leukocytosis.  Patient's presentation is most consistent with acute complicated illness / injury requiring diagnostic workup.   After receiving morphine the patient's pain is well-controlled  Discussed case care and clinical history with my oncoming partner Dr. Dolores Frame, and she will follow-up on CT results and reassessment of the patient.     FINAL CLINICAL IMPRESSION(S) / ED DIAGNOSES   Final diagnoses:  Ureteral colic     Sharyn Creamer, MD 11/23/23 [phone]

## 2023-11-22 ENCOUNTER — Ambulatory Visit: Payer: Self-pay | Admitting: Family Medicine

## 2023-11-22 ENCOUNTER — Other Ambulatory Visit: Payer: Self-pay

## 2023-11-22 ENCOUNTER — Telehealth (INDEPENDENT_AMBULATORY_CARE_PROVIDER_SITE_OTHER): Payer: Self-pay | Admitting: Nurse Practitioner

## 2023-11-22 ENCOUNTER — Other Ambulatory Visit: Payer: Self-pay | Admitting: Family Medicine

## 2023-11-22 DIAGNOSIS — I10 Essential (primary) hypertension: Secondary | ICD-10-CM

## 2023-11-22 MED ORDER — TAMSULOSIN HCL 0.4 MG PO CAPS
0.4000 mg | ORAL_CAPSULE | Freq: Every day | ORAL | 0 refills | Status: DC
  Filled 2023-11-22: qty 14, 14d supply, fill #0

## 2023-11-22 MED ORDER — CEPHALEXIN 500 MG PO CAPS
500.0000 mg | ORAL_CAPSULE | Freq: Once | ORAL | Status: AC
Start: 2023-11-22 — End: 2023-11-22
  Administered 2023-11-22: 500 mg via ORAL
  Filled 2023-11-22: qty 1

## 2023-11-22 MED ORDER — BENAZEPRIL HCL 20 MG PO TABS: 40.0000 mg | ORAL_TABLET | Freq: Every day | ORAL | Status: DC

## 2023-11-22 MED ORDER — CEPHALEXIN 500 MG PO CAPS
500.0000 mg | ORAL_CAPSULE | Freq: Three times a day (TID) | ORAL | 0 refills | Status: DC
  Filled 2023-11-22: qty 21, 7d supply, fill #0

## 2023-11-22 MED ORDER — OXYCODONE-ACETAMINOPHEN 5-325 MG PO TABS
1.0000 | ORAL_TABLET | Freq: Once | ORAL | Status: AC
  Administered 2023-11-22: 1 via ORAL
  Filled 2023-11-22: qty 1

## 2023-11-22 MED ORDER — BENAZEPRIL HCL 40 MG PO TABS
40.0000 mg | ORAL_TABLET | Freq: Every day | ORAL | 0 refills | Status: DC
  Filled 2023-11-22: qty 30, 30d supply, fill #0

## 2023-11-22 MED ORDER — ONDANSETRON 4 MG PO TBDP
4.0000 mg | ORAL_TABLET | Freq: Three times a day (TID) | ORAL | 0 refills | Status: DC | PRN
  Filled 2023-11-22: qty 30, 10d supply, fill #0

## 2023-11-22 MED ORDER — BENAZEPRIL HCL 20 MG PO TABS
40.0000 mg | ORAL_TABLET | ORAL | Status: AC
  Administered 2023-11-22: 40 mg via ORAL
  Filled 2023-11-22: qty 2

## 2023-11-22 MED ORDER — TAMSULOSIN HCL 0.4 MG PO CAPS
0.4000 mg | ORAL_CAPSULE | Freq: Once | ORAL | Status: AC
  Administered 2023-11-22: 0.4 mg via ORAL
  Filled 2023-11-22: qty 1

## 2023-11-22 MED ORDER — OXYCODONE-ACETAMINOPHEN 5-325 MG PO TABS: 1.0000 | ORAL_TABLET | ORAL | 0 refills | Status: DC | PRN

## 2023-11-22 NOTE — Progress Notes (Signed)
 Name: Eddie Evans   MRN: [id number]    DOB: May 15, 1996   Date:11/22/2023       Progress Note  Subjective  Chief Complaint  Chief Complaint  Patient presents with   Hypertension   Medication Refill    I connected with  Eddie Evans  on 11/22/23 at  2:00 PM EST by a video enabled telemedicine application and verified that I am speaking with the correct person using two identifiers.  I discussed the limitations of evaluation and management by telemedicine and the availability of in person appointments. The patient expressed understanding and agreed to proceed with a virtual visit  Staff also discussed with the patient that there may be a patient responsible charge related to this service. Patient Location: home Provider Location: [facility] Additional Individuals present: family  Hypertension  Medication Refill    Discussed the use of AI scribe software for clinical note transcription with the patient, who gave verbal consent to proceed.  History of Present Illness   The patient presents with elevated blood pressure due to running out of hypertension medication.  He has been out of his blood pressure medication, benazepril 40 mg daily, for approximately four weeks and five days, leading to elevated blood pressure readings. A recent measurement was 150/110 mmHg. His blood pressure is usually controlled when he is on his medication.  He was in the emergency room for nine hours yesterday due to flank pain, where he was diagnosed with two kidney stones.    Patient Active Problem List   Diagnosis Date Noted   Bacterial infection of gastrointestinal tract 05/08/2023   GAD (generalized anxiety disorder) 05/08/2023   Alcohol consumption binge drinking 05/08/2023   History of CVA (cerebrovascular accident) 09/12/2022   Hypertension 04/18/2015    Social History   Tobacco Use   Smoking status: Former    Current packs/day: 0.00    Types: Cigarettes, E-cigarettes    Quit date:  08/2022    Years since quitting: 1.2   Smokeless tobacco: Former    Types: Snuff, Chew   Tobacco comments:    1 pack every 2 weeks  Substance Use Topics   Alcohol use: Yes    Comment: daily     Current Outpatient Medications:    benazepril (LOTENSIN) 40 MG tablet, Take 1 tablet (40 mg total) by mouth daily., Disp: 30 tablet, Rfl: 0   busPIRone (BUSPAR) 5 MG tablet, TAKE 1 TABLET BY MOUTH 3 TIMES DAILY AS NEEDED., Disp: 90 tablet, Rfl: 0   cephALEXin (KEFLEX) 500 MG capsule, Take 1 capsule (500 mg total) by mouth 3 (three) times daily., Disp: 21 capsule, Rfl: 0   nicotine polacrilex (NICORETTE) 2 MG gum, Take 1 each (2 mg total) by mouth as needed for smoking cessation. (Patient not taking: Reported on 05/08/2023), Disp: 100 tablet, Rfl: 0   ondansetron (ZOFRAN-ODT) 4 MG disintegrating tablet, Take 1 tablet (4 mg total) by mouth every 8 (eight) hours as needed for nausea or vomiting., Disp: 30 tablet, Rfl: 0   oxyCODONE-acetaminophen (PERCOCET/ROXICET) 5-325 MG tablet, Take 1 tablet by mouth every 4 (four) hours as needed for severe pain (pain score 7-10)., Disp: 30 tablet, Rfl: 0   tamsulosin (FLOMAX) 0.4 MG CAPS capsule, Take 1 capsule (0.4 mg total) by mouth daily., Disp: 14 capsule, Rfl: 0  Allergies  Allergen Reactions   Tape Itching    I personally reviewed active problem list, medication list, allergies, notes from last encounter with the patient/caregiver today.  ROS  Constitutional: Negative for fever or weight change.  Respiratory: Negative for cough and shortness of breath.   Cardiovascular: Negative for chest pain or palpitations.  Gastrointestinal: Negative for abdominal pain, no bowel changes.  Musculoskeletal: Negative for gait problem or joint swelling.  Skin: Negative for rash.  Neurological: Negative for dizziness or headache.  No other specific complaints in a complete review of systems (except as listed in HPI above).   Objective  Virtual encounter, vitals  not obtained.  There is no height or weight on file to calculate BMI.  Nursing Note and Vital Signs reviewed.  Physical Exam  Awake, alert and oriented, speaking in complete sentences   Results for orders placed or performed during the hospital encounter of 11/21/23 (from the past 72 hours)  Urinalysis, Routine w reflex microscopic -Urine, Clean Catch     Status: Abnormal   Collection Time: 11/21/23  6:50 PM  Result Value Ref Range   Color, Urine YELLOW (A) YELLOW   APPearance HAZY (A) CLEAR   Specific Gravity, Urine 1.016 1.005 - 1.030   pH 6.0 5.0 - 8.0   Glucose, UA NEGATIVE NEGATIVE mg/dL   Hgb urine dipstick LARGE (A) NEGATIVE   Bilirubin Urine NEGATIVE NEGATIVE   Ketones, ur NEGATIVE NEGATIVE mg/dL   Protein, ur 621 (A) NEGATIVE mg/dL   Nitrite NEGATIVE NEGATIVE   Leukocytes,Ua NEGATIVE NEGATIVE   RBC / HPF >50 0 - 5 RBC/hpf   WBC, UA 6-10 0 - 5 WBC/hpf   Bacteria, UA NONE SEEN NONE SEEN   Squamous Epithelial / HPF 0 0 - 5 /HPF   Mucus PRESENT     Comment: Performed at [facility], [address] [place], [place] [address]  Lipase, blood     Status: None   Collection Time: 11/21/23  6:51 PM  Result Value Ref Range   Lipase 38 11 - 51 U/L    Comment: Performed at [facility], [address] [place], [place] [address]  Comprehensive metabolic panel     Status: Abnormal   Collection Time: 11/21/23  6:51 PM  Result Value Ref Range   Sodium 137 135 - 145 mmol/L   Potassium 3.6 3.5 - 5.1 mmol/L   Chloride 102 98 - 111 mmol/L   CO2 22 22 - 32 mmol/L   Glucose, Bld 124 (H) 70 - 99 mg/dL    Comment: Glucose reference range applies only to samples taken after fasting for at least 8 hours.   BUN 10 6 - 20 mg/dL   Creatinine, Ser 6.29 0.61 - 1.24 mg/dL   Calcium 9.6 8.9 - 52.8 mg/dL   Total Protein 7.8 6.5 - 8.1 g/dL   Albumin 4.9 3.5 - 5.0 g/dL   AST 22 15 - 41 U/L   ALT 20 0 - 44 U/L   Alkaline Phosphatase 74 38 - 126 U/L   Total Bilirubin 0.7  0.0 - 1.2 mg/dL   GFR, Estimated >41 >32 mL/min    Comment: (NOTE) Calculated using the CKD-EPI Creatinine Equation (2021)    Anion gap 13 5 - 15    Comment: Performed at [facility], [address] [place], [place] [address]  CBC     Status: Abnormal   Collection Time: 11/21/23  6:51 PM  Result Value Ref Range   WBC 18.2 (H) 4.0 - 10.5 K/uL   RBC 5.02 4.22 - 5.81 MIL/uL   Hemoglobin 15.3 13.0 - 17.0 g/dL   HCT 27.2 53.6 - 64.4 %  MCV 87.6 80.0 - 100.0 fL   MCH 30.5 26.0 - 34.0 pg   MCHC 34.8 30.0 - 36.0 g/dL   RDW 95.6 21.3 - 08.6 %   Platelets 279 150 - 400 K/uL   nRBC 0.0 0.0 - 0.2 %    Comment: Performed at [facility], [address] [place], [place] [address]    Assessment & Plan  Assessment and Plan    Hypertension Uncontrolled due to non-adherence to medication (Benazepril 40mg  daily) for the past 4 weeks and 5 days. Blood pressure was 150/110 at last visit. -Resume Benazepril 40mg  daily. -Provide a one-month supply of medication. -Recommend follow-up with primary care physician (Dr. Sherrie Mustache).  Kidney Stones Recent diagnosis with associated pain. No further details provided in the conversation. -Recommend follow-up with primary care physician (Dr. Sherrie Mustache) for further management.         -Red flags and when to present for emergency care or RTC including fever >101.43F, chest pain, shortness of breath, new/worsening/un-resolving symptoms,  reviewed with patient at time of visit. Follow up and care instructions discussed and provided in AVS. - I discussed the assessment and treatment plan with the patient. The patient was provided an opportunity to ask questions and all were answered. The patient agreed with the plan and demonstrated an understanding of the instructions.  I provided 15 minutes of non-face-to-face time during this encounter.  Berniece Salines, FNP

## 2023-11-22 NOTE — Telephone Encounter (Signed)
 Chief Complaint: out of medication Symptoms: elevated blood pressure Frequency: NA  Pertinent Negatives: Patient denies chest pain and all other symptoms  Disposition: [ ] ED /[ ] Urgent Care (no appt availability in office) / [ X]Appointment(In office/virtual)/ [ ]  [place] [facility]/ [ ] Home Care/ [ ] Refused Recommended Disposition /[ ] [place] [facility]/ [ ]  Follow-up with PCP  Additional Notes: Patient calling saying he has been out of his blood pressure medication for 3 weeks, was at the ER last evening for kidney pain "passing a stone". Stated prescription refill not at the pharmacy. Office visit advised, no availability with PCP, scheduled virtual visit with another provider today.    Copied from CRM [id number]. Topic: Clinical - Red Word Triage >> Nov 22, 2023  9:44 AM Patsy Lager T wrote: Red Word that prompted transfer to Nurse Triage: patient was at another appt on yesterday with a BP of 150/120. He needs his medication  Hasn't been seen since August Reason for Disposition  Ran out of BP medications  Answer Assessment - Initial Assessment Questions 1. BLOOD PRESSURE: "What is the blood pressure?" "Did you take at least two measurements 5 minutes apart?"     150/120 2. ONSET: "When did you take your blood pressure?"     yesterday 3. HOW: "How did you take your blood pressure?" (e.g., automatic home BP monitor, visiting nurse)     At doctors office 4. HISTORY: "Do you have a history of high blood pressure?"     yes 5. MEDICINES: "Are you taking any medicines for blood pressure?" "Have you missed any doses recently?"     Benzapril 6. OTHER SYMPTOMS: "Do you have any symptoms?" (e.g., blurred vision, chest pain, difficulty breathing, headache, weakness)     Pain from passing kidney stone  Protocols used: Blood Pressure - High-A-AH

## 2023-11-22 NOTE — ED Provider Notes (Signed)
-----------------------------------------   12:36 AM on 11/22/2023 -----------------------------------------   CT abdomen/pelvis: 1. 2 mm obstructing ureteral calculus within the mid right ureter.  2. 2 mm nonobstructing left renal calculus.   Patient feeling better.  Will cover empirically with Keflex.  Patient asking for Lotensin dose.  Has an appointment with his PCP around 9 AM for blood pressure check and refill.  Will discharge home on as needed Percocet/Zofran, 14-day supply of Flomax and will refer patient to urology for outpatient follow-up.  Strict return precautions given.  Patient verbalizes understanding and agrees with plan of care.   Irean Hong, MD 11/22/23 [phone]

## 2023-11-22 NOTE — Discharge Instructions (Signed)
 1. Take pain & nausea medicines as needed (Percocet/Zofran #30). Make sure to take a stool softener while taking narcotic pain medicines. 2. Take Flomax 0.4mg  daily x 14 days. 3. Drink plenty of bottled or filtered water daily. 4. Take and finish antibiotic as prescribed. 5. Return to the ER for worsening symptoms, persistent vomiting, fever, difficulty breathing or other concerns.

## 2023-11-24 ENCOUNTER — Other Ambulatory Visit: Payer: Self-pay

## 2023-11-25 ENCOUNTER — Other Ambulatory Visit: Payer: Self-pay

## 2023-11-28 ENCOUNTER — Other Ambulatory Visit: Payer: Self-pay

## 2023-12-24 ENCOUNTER — Other Ambulatory Visit: Payer: Self-pay | Admitting: Family Medicine

## 2023-12-24 ENCOUNTER — Other Ambulatory Visit: Payer: Self-pay

## 2023-12-24 DIAGNOSIS — I10 Essential (primary) hypertension: Secondary | ICD-10-CM

## 2023-12-25 ENCOUNTER — Other Ambulatory Visit: Payer: Self-pay

## 2023-12-25 MED FILL — Benazepril HCl Tab 40 MG: ORAL | 30 days supply | Qty: 30 | Fill #0 | Status: AC

## 2024-01-21 ENCOUNTER — Other Ambulatory Visit: Payer: Self-pay

## 2024-01-21 ENCOUNTER — Other Ambulatory Visit: Payer: Self-pay | Admitting: Family Medicine

## 2024-01-21 DIAGNOSIS — I10 Essential (primary) hypertension: Secondary | ICD-10-CM

## 2024-01-21 MED ORDER — BENAZEPRIL HCL 40 MG PO TABS
40.0000 mg | ORAL_TABLET | Freq: Every day | ORAL | 1 refills | Status: DC
  Filled 2024-01-21: qty 15, 15d supply, fill #0
  Filled 2024-02-16: qty 15, 15d supply, fill #1

## 2024-02-17 ENCOUNTER — Other Ambulatory Visit: Payer: Self-pay

## 2024-02-17 ENCOUNTER — Ambulatory Visit: Payer: Self-pay

## 2024-02-17 NOTE — Telephone Encounter (Signed)
 Copied from CRM [id number]. Topic: Clinical - Red Word Triage >> Feb 17, 2024 12:13 PM Rachelle R wrote: [facility] that prompted transfer to Nurse Triage: Patient states since yesterday at work started with a sore throat. Currently has a productive cough, weakness and his throat is swollen. Says at night because he is congested has difficulty breathing.    Chief Complaint: Cough, sore throat, weakness. Symptoms: above Frequency: yesterday Pertinent Negatives: Patient denies  Disposition: [] ED /[] Urgent Care (no appt availability in office) / [x] Appointment(In office/virtual)/ []  [facility] [facility]/ [] Home Care/ [] Refused Recommended Disposition /[] [place] [facility]/ []  Follow-up with PCP Additional Notes: agrees with appointment.  Reason for Disposition  [1] Continuous (nonstop) coughing interferes with work or school AND [2] no improvement using cough treatment per Care Advice  Answer Assessment - Initial Assessment Questions 1. ONSET: "When did the cough begin?"      yesterday 2. SEVERITY: "How bad is the cough today?"      severe 3. SPUTUM: "Describe the color of your sputum" (none, dry cough; clear, white, yellow, green)     bloody 4. HEMOPTYSIS: "Are you coughing up any blood?" If so ask: "How much?" (flecks, streaks, tablespoons, etc.)     yes 5. DIFFICULTY BREATHING: "Are you having difficulty breathing?" If Yes, ask: "How bad is it?" (e.g., mild, moderate, severe)    - MILD: No SOB at rest, mild SOB with walking, speaks normally in sentences, can lie down, no retractions, pulse < 100.    - MODERATE: SOB at rest, SOB with minimal exertion and prefers to sit, cannot lie down flat, speaks in phrases, mild retractions, audible wheezing, pulse 100-120.    - SEVERE: Very SOB at rest, speaks in single words, struggling to breathe, sitting hunched forward, retractions, pulse > 120      no 6. FEVER: "Do you have a fever?" If Yes, ask: "What is your temperature, how was it measured,  and when did it start?"     100.0 7. CARDIAC HISTORY: "Do you have any history of heart disease?" (e.g., heart attack, congestive heart failure)      no 8. LUNG HISTORY: "Do you have any history of lung disease?"  (e.g., pulmonary embolus, asthma, emphysema)     no 9. PE RISK FACTORS: "Do you have a history of blood clots?" (or: recent major surgery, recent prolonged travel, bedridden)     no 10. OTHER SYMPTOMS: "Do you have any other symptoms?" (e.g., runny nose, wheezing, chest pain)       Sore throat 11. PREGNANCY: "Is there any chance you are pregnant?" "When was your last menstrual period?"       N/a 12. TRAVEL: "Have you traveled out of the country in the last month?" (e.g., travel history, exposures)       no  Protocols used: Cough - Acute Productive-A-AH

## 2024-02-18 ENCOUNTER — Encounter: Payer: Self-pay | Admitting: Family Medicine

## 2024-02-18 ENCOUNTER — Ambulatory Visit: Payer: Self-pay | Admitting: Family Medicine

## 2024-02-18 ENCOUNTER — Other Ambulatory Visit: Payer: Self-pay

## 2024-02-18 VITALS — BP 165/117 | Temp 99.5°F | Wt 163.2 lb

## 2024-02-18 DIAGNOSIS — I10 Essential (primary) hypertension: Secondary | ICD-10-CM

## 2024-02-18 DIAGNOSIS — J020 Streptococcal pharyngitis: Secondary | ICD-10-CM

## 2024-02-18 LAB — POCT RAPID STREP A (OFFICE): Rapid Strep A Screen: POSITIVE — AB

## 2024-02-18 LAB — POC COVID19/FLU A&B COMBO
Covid Antigen, POC: NEGATIVE
Influenza A Antigen, POC: NEGATIVE
Influenza B Antigen, POC: NEGATIVE

## 2024-02-18 MED ORDER — METHYLPREDNISOLONE ACETATE 40 MG/ML IJ SUSP
40.0000 mg | Freq: Once | INTRAMUSCULAR | Status: AC
  Administered 2024-02-18: 40 mg via INTRAMUSCULAR

## 2024-02-18 MED ORDER — AMOXICILLIN 875 MG PO TABS
875.0000 mg | ORAL_TABLET | Freq: Two times a day (BID) | ORAL | 0 refills | Status: AC
  Filled 2024-02-18: qty 14, 7d supply, fill #0

## 2024-02-18 NOTE — Progress Notes (Signed)
 ACUTE VISIT   Patient: Eddie Evans   DOB: 07-26-1996   27 y.o. Male  MRN: [id number]   PCP: Lamon Pillow, MD  Chief Complaint  Patient presents with   Cough    Started about 3 days ago and when he is coughing up phlegm it is clear that is bubbly. Stuffy nose, sinus pressure, not able to blow anything out.  Stated he is very sleepy.  Tried to drink hot tea was scared to drink because of how sore his throat is and honey but was able to swallow honey.      Sore Throat    Started 2 days ago.  States that he can't swallow and painful when he does.  States so bad that he could cry.  Main complaint, his throat has not had anything to eat in 2 days.  Drinking ginger ale, states he is tasting blood afterwards.   Fever    Ranges 101.7 3 days ago, 101.5 2 days ago   Hypertension    Reports he has not had his blood pressure for about a week.  States they are going to pick it up after this appointment.   Subjective    HPI HPI     Cough    Additional comments: Started about 3 days ago and when he is coughing up phlegm it is clear that is bubbly. Stuffy nose, sinus pressure, not able to blow anything out.  Stated he is very sleepy.  Tried to drink hot tea was scared to drink because of how sore his throat is and honey but was able to swallow honey.           Sore Throat    Additional comments: Started 2 days ago.  States that he can't swallow and painful when he does.  States so bad that he could cry.  Main complaint, his throat has not had anything to eat in 2 days.  Drinking ginger ale, states he is tasting blood afterwards.        Fever    Additional comments: Ranges 101.7 3 days ago, 101.5 2 days ago        Hypertension    Additional comments: Reports he has not had his blood pressure for about a week.  States they are going to pick it up after this appointment.      Last edited by Michaelene Admire L on 02/18/2024  2:32 PM.       Discussed the use of AI  scribe software for clinical note transcription with the patient, who gave verbal consent to proceed.  History of Present Illness Eddie Evans is a 28 year old male who presents with cough and sore throat.  He has experienced a sore throat and dry cough for three days. The cough is non-productive and exacerbated by throat soreness, leading to a decreased appetite and difficulty in drinking water.  He attended a child's birthday party on Saturday, where approximately five other attendees have since developed similar symptoms. His sister and her husband are also currently ill.  He experiences fatigue, hot and cold flashes, and sore legs, which he recalls were similar to symptoms he had during a previous COVID-19 infection. He feels tired and sleepy, requiring his sister to accompany him to ensure he stays awake while driving.  He has been without his blood pressure medications, benazepril  40 mg and buspirone  5 mg, for one week. His blood pressure was recorded at 160/117 during the  visit.  No diarrhea. He has a dry cough, sore throat, and fatigue. No wheezing or crackles in his lungs.     Medications: Outpatient Medications Prior to Visit  Medication Sig   benazepril  (LOTENSIN ) 40 MG tablet Take 1 tablet (40 mg total) by mouth daily.   busPIRone  (BUSPAR ) 5 MG tablet TAKE 1 TABLET BY MOUTH 3 TIMES DAILY AS NEEDED.   cephALEXin  (KEFLEX ) 500 MG capsule Take 1 capsule (500 mg total) by mouth 3 (three) times daily.   nicotine  polacrilex (NICORETTE ) 2 MG gum Take 1 each (2 mg total) by mouth as needed for smoking cessation.   ondansetron  (ZOFRAN -ODT) 4 MG disintegrating tablet Dissolve 1 tablet (4 mg total) in the mouth every 8 (eight) hours as needed for nausea or vomiting.   oxyCODONE -acetaminophen  (PERCOCET/ROXICET) 5-325 MG tablet Take 1 tablet by mouth every 4 (four) hours as needed for severe pain (pain score 7-10).   tamsulosin  (FLOMAX ) 0.4 MG CAPS capsule Take 1 capsule (0.4 mg total) by  mouth daily.   No facility-administered medications prior to visit.    {Insert previous labs (optional):23779} {See past labs  Heme  Chem  Endocrine  Serology  Results Review (optional):1}   Objective    BP (!) 165/117 (BP Location: Right Arm, Patient Position: Sitting, Cuff Size: Normal)   Temp 99.5 F (37.5 C) (Oral)   Wt 163 lb 3.2 oz (74 kg)   SpO2 100%   BMI 28.01 kg/m  BP Readings from Last 3 Encounters:  02/18/24 (!) 165/117  11/21/23 (!) 157/104  07/11/23 (!) 144/92   Wt Readings from Last 3 Encounters:  02/18/24 163 lb 3.2 oz (74 kg)  11/21/23 170 lb (77.1 kg)  07/11/23 190 lb (86.2 kg)    {See vitals history (optional):1}  Physical Exam   Physical Exam Physical Exam    VITALS: T- 99.5, BP- 160/117, SaO2- 100% GENERAL: Ill appearing, tired appearing. HEENT: 2+ tonsils bilaterally, oropharynx erythematous, soft palate erythematous, no tonsillar exudate, bilaterally impacted cerumen. NECK: Enlarged anterior cervical lymph nodes, right anterior cervical chain more pronounced. CHEST: Lungs clear to auscultation, no wheezing or crackles.   No results found for any visits on 02/18/24.  Assessment & Plan     Assessment & Plan Strep Pharyngitis Acute sore throat with erythematous oropharynx and enlarged tonsils. Positive rapid strep test. Symptoms include dry cough, sore throat, and difficulty swallowing. No tonsillar exudate. Enlarged anterior cervical lymph nodes, more pronounced on the right. Reports fatigue, hot and cold flashes, and decreased appetite. Negative for COVID and influenza. Informed consent obtained for Depo Medrol injection to alleviate symptoms and improve comfort. - Administer Depo Medrol 40 mg in clinic today - Prescribe amoxicillin  875 mg twice a day for 7 days Work note provided to return on 02/20/24  Hypertension Severely elevated blood pressure at 160/117. Has been out of antihypertensive medications for one week. Medications include  benazepril  40 mg and buspar  5 mg. Discussed the importance of resuming antihypertensive therapy to prevent complications such as stroke or heart attack. - Advise follow-up with PCP in 3-4 weeks for elevated blood pressure      No follow-ups on file.        Mimi Alt, MD  [facility] [phone] (phone) [phone] (fax)  [facility]

## 2024-02-20 LAB — SPECIMEN STATUS REPORT

## 2024-02-20 LAB — CULTURE, GROUP A STREP: Strep A Culture: POSITIVE — AB

## 2024-03-03 ENCOUNTER — Other Ambulatory Visit: Payer: Self-pay | Admitting: Family Medicine

## 2024-03-03 ENCOUNTER — Other Ambulatory Visit: Payer: Self-pay

## 2024-03-03 DIAGNOSIS — I10 Essential (primary) hypertension: Secondary | ICD-10-CM

## 2024-03-03 MED FILL — Benazepril HCl Tab 40 MG: ORAL | 15 days supply | Qty: 15 | Fill #0 | Status: AC

## 2024-03-13 ENCOUNTER — Telehealth: Payer: Self-pay | Admitting: Family Medicine

## 2024-03-13 ENCOUNTER — Ambulatory Visit: Payer: Self-pay

## 2024-03-13 NOTE — Telephone Encounter (Signed)
 FYI Only or Action Required?: FYI only for provider.  Patient was last seen in primary care on 02/18/2024 by Sharma Coyer, MD. Called Nurse Triage reporting Diarrhea. Symptoms began several days ago. Interventions attempted: Nothing. Symptoms are: unchanged.  Triage Disposition: See Physician Within 24 Hours                             Patient/caregiver understands and will follow disposition?:  Reason for Disposition  [1] MODERATE diarrhea (e.g., 4-6 times / day more than normal) AND [2] present > 48 hours (2 days)  Answer Assessment - Initial Assessment Questions This RN scheduled patient for a virtual urgent care visit today as no appointment availability in office.    1. DIARRHEA SEVERITY: How bad is the diarrhea? How many more stools have you had in the past 24 hours than normal?    - NO DIARRHEA (SCALE 0)   - MILD (SCALE 1-3): Few loose or mushy BMs; increase of 1-3 stools over normal daily number of stools; mild increase in ostomy output.   -  MODERATE (SCALE 4-7): Increase of 4-6 stools daily over normal; moderate increase in ostomy output.   -  SEVERE (SCALE 8-10; OR WORST POSSIBLE): Increase of 7 or more stools daily over normal; moderate increase in ostomy output; incontinence.     Moderate, 4-5 times a day; vomited today for first time 2. ONSET: When did the diarrhea begin?      Two days ago 3. BM CONSISTENCY: How loose or watery is the diarrhea?      Watery, no blood 4. VOMITING: Are you also vomiting? If Yes, ask: How many times in the past 24 hours?      One time, has been nauseous 5. ABDOMEN PAIN: Are you having any abdomen pain? If Yes, ask: What does it feel like? (e.g., crampy, dull, intermittent, constant)      Not right now, had some last night; cramp last night 6. ABDOMEN PAIN SEVERITY: If present, ask: How bad is the pain?  (e.g., Scale 1-10; mild, moderate, or severe)   - MILD (1-3): doesn't interfere with  normal activities, abdomen soft and not tender to touch    - MODERATE (4-7): interferes with normal activities or awakens from sleep, abdomen tender to touch    - SEVERE (8-10): excruciating pain, doubled over, unable to do any normal activities       No pain 7. ORAL INTAKE: If vomiting, Have you been able to drink liquids? How much liquids have you had in the past 24 hours?     Drinking liquids 8. HYDRATION: Any signs of dehydration? (e.g., dry mouth [not just dry lips], too weak to stand, dizziness, new weight loss) When did you last urinate?     Dizzy when in the heat outside 9. EXPOSURE: Have you traveled to a foreign country recently? Have you been exposed to anyone with diarrhea? Could you have eaten any food that was spoiled?     No; no; not aware but wouldn't surprise me 10. ANTIBIOTIC USE: Are you taking antibiotics now or have you taken antibiotics in the past 2 months?       No 11. OTHER SYMPTOMS: Do you have any other symptoms? (e.g., fever, blood in stool)       Denies fever, blood in stool  Protocols used: Diarrhea-A-AH

## 2024-03-13 NOTE — Telephone Encounter (Signed)
 Called pt - left message on machine to return our call     Summary: Eddie Evans   Patient is calling in for having diarreah for the last 2 days, with vomiting and not being able to eat. Please contact patient at number [phone]

## 2024-03-13 NOTE — Progress Notes (Signed)
 Pt did not show for visit [facility]

## 2024-03-14 ENCOUNTER — Other Ambulatory Visit: Payer: Self-pay

## 2024-03-14 ENCOUNTER — Telehealth: Payer: Self-pay | Admitting: Nurse Practitioner

## 2024-03-14 DIAGNOSIS — A084 Viral intestinal infection, unspecified: Secondary | ICD-10-CM

## 2024-03-14 MED ORDER — LOPERAMIDE HCL 2 MG PO TABS
2.0000 mg | ORAL_TABLET | Freq: Three times a day (TID) | ORAL | 0 refills | Status: DC | PRN
  Filled 2024-03-14: qty 12, 4d supply, fill #0

## 2024-03-14 MED ORDER — ONDANSETRON 4 MG PO TBDP
4.0000 mg | ORAL_TABLET | Freq: Four times a day (QID) | ORAL | 0 refills | Status: DC | PRN
  Filled 2024-03-14: qty 30, 8d supply, fill #0

## 2024-03-14 NOTE — Patient Instructions (Signed)
 Ozell KATHEE Lime, thank you for joining [name], NP for today's virtual visit.  While this provider is not your primary care provider (PCP), if your PCP is located in our provider database this encounter information will be shared with them immediately following your visit.   A [facility] MyChart account gives you access to today's visit and all your visits, tests, and labs performed at [facility]  click here if you don't have a [facility] MyChart account or go to [email or web address]  Consent: (Patient) Eddie Evans provided verbal consent for this virtual visit at the beginning of the encounter.  Current Medications:  Current Outpatient Medications:    loperamide (IMODIUM A-D) 2 MG tablet, Take 1 tablet (2 mg total) by mouth 3 (three) times daily as needed for diarrhea or loose stools., Disp: 30 tablet, Rfl: 0   benazepril  (LOTENSIN ) 40 MG tablet, Take 1 tablet (40 mg total) by mouth daily., Disp: 15 tablet, Rfl: 1   busPIRone  (BUSPAR ) 5 MG tablet, TAKE 1 TABLET BY MOUTH 3 TIMES DAILY AS NEEDED., Disp: 90 tablet, Rfl: 0   nicotine  polacrilex (NICORETTE ) 2 MG gum, Take 1 each (2 mg total) by mouth as needed for smoking cessation., Disp: 100 tablet, Rfl: 0   ondansetron  (ZOFRAN -ODT) 4 MG disintegrating tablet, Take 1 tablet (4 mg total) by mouth every 6 (six) hours as needed for nausea or vomiting., Disp: 32 tablet, Rfl: 0   oxyCODONE -acetaminophen  (PERCOCET/ROXICET) 5-325 MG tablet, Take 1 tablet by mouth every 4 (four) hours as needed for severe pain (pain score 7-10)., Disp: 30 tablet, Rfl: 0   tamsulosin  (FLOMAX ) 0.4 MG CAPS capsule, Take 1 capsule (0.4 mg total) by mouth daily., Disp: 14 capsule, Rfl: 0   Medications ordered in this encounter:  Meds ordered this encounter  Medications   ondansetron  (ZOFRAN -ODT) 4 MG disintegrating tablet    Sig: Take 1 tablet (4 mg total) by mouth every 6 (six) hours as needed for nausea or vomiting.    Dispense:  32  tablet    Refill:  0    Supervising Provider:   LAMPTEY, PHILIP O [[id number]]   loperamide (IMODIUM A-D) 2 MG tablet    Sig: Take 1 tablet (2 mg total) by mouth 3 (three) times daily as needed for diarrhea or loose stools.    Dispense:  30 tablet    Refill:  0    Supervising Provider:   LAMPTEY, PHILIP O [[id number]]     *If you need refills on other medications prior to your next appointment, please contact your pharmacy*  Follow-Up: Call back or seek an in-person evaluation if the symptoms worsen or if the condition fails to improve as anticipated.  [facility] [phone]  Other Instructions Clear liquid diet until able to advance to solids without nausea or vomiting   If you have been instructed to have an in-person evaluation today at a local Urgent Care facility, please use the link below. It will take you to a list of all of our available [facility], including address, phone number and hours of operation. Please do not delay care.  [facility]  If you or a family member do not have a primary care provider, use the link below to schedule a visit and establish care. When you choose a [facility] primary care physician or advanced practice provider, you gain a long-term partner in health. Find a Primary Care Provider  Learn more about 's  in-office and virtual care options: [facility] Get Care Now

## 2024-03-14 NOTE — Progress Notes (Signed)
 Virtual Visit Consent   Eddie Evans, you are scheduled for a virtual visit with a [facility] provider today. Just as with appointments in the office, your consent must be obtained to participate. Your consent will be active for this visit and any virtual visit you may have with one of our providers in the next 365 days. If you have a MyChart account, a copy of this consent can be sent to you electronically.  As this is a virtual visit, video technology does not allow for your provider to perform a traditional examination. This may limit your provider's ability to fully assess your condition. If your provider identifies any concerns that need to be evaluated in person or the need to arrange testing (such as labs, EKG, etc.), we will make arrangements to do so. Although advances in technology are sophisticated, we cannot ensure that it will always work on either your end or our end. If the connection with a video visit is poor, the visit may have to be switched to a telephone visit. With either a video or telephone visit, we are not always able to ensure that we have a secure connection.  By engaging in this virtual visit, you consent to the provision of healthcare and authorize for your insurance to be billed (if applicable) for the services provided during this visit. Depending on your insurance coverage, you may receive a charge related to this service.  I need to obtain your verbal consent now. Are you willing to proceed with your visit today? Ozell NOVAK Schaffert has provided verbal consent on 03/14/2024 for a virtual visit (video or telephone). [name], NP  Date: 03/14/2024 9:00 AM   Virtual Visit via Video Note   I, [name], connected with  Eddie Evans  ([id number], 22-Aug-1996) on 03/14/24 at  9:00 AM EDT by a video-enabled telemedicine application and verified that I am speaking with the correct person using two identifiers.  Location: Patient: Virtual Visit Location  Patient: Home Provider: Virtual Visit Location Provider: Home Office   I discussed the limitations of evaluation and management by telemedicine and the availability of in person appointments. The patient expressed understanding and agreed to proceed.    History of Present Illness: Eddie Evans is a 28 y.o. who identifies as a male who was assigned male at birth, and is being seen today for viral gastroenteritis.   Mr. Kundrat has been experiencing nausea, decreased appetite, vomiting and loose stools over the past 3 days.  He denies fever, bloody stools, hematuria or back pain.   Problems:  Patient Active Problem List   Diagnosis Date Noted   Bacterial infection of gastrointestinal tract 05/08/2023   GAD (generalized anxiety disorder) 05/08/2023   Alcohol consumption binge drinking 05/08/2023   History of CVA (cerebrovascular accident) 09/12/2022   Hypertension 04/18/2015    Allergies:  Allergies  Allergen Reactions   Tape Itching   Medications:  Current Outpatient Medications:    loperamide (IMODIUM A-D) 2 MG tablet, Take 1 tablet (2 mg total) by mouth 3 (three) times daily as needed for diarrhea or loose stools., Disp: 30 tablet, Rfl: 0   benazepril  (LOTENSIN ) 40 MG tablet, Take 1 tablet (40 mg total) by mouth daily., Disp: 15 tablet, Rfl: 1   busPIRone  (BUSPAR ) 5 MG tablet, TAKE 1 TABLET BY MOUTH 3 TIMES DAILY AS NEEDED., Disp: 90 tablet, Rfl: 0   nicotine  polacrilex (NICORETTE ) 2 MG gum, Take 1 each (2 mg total) by mouth as  needed for smoking cessation., Disp: 100 tablet, Rfl: 0   ondansetron  (ZOFRAN -ODT) 4 MG disintegrating tablet, Take 1 tablet (4 mg total) by mouth every 6 (six) hours as needed for nausea or vomiting., Disp: 32 tablet, Rfl: 0   oxyCODONE -acetaminophen  (PERCOCET/ROXICET) 5-325 MG tablet, Take 1 tablet by mouth every 4 (four) hours as needed for severe pain (pain score 7-10)., Disp: 30 tablet, Rfl: 0   tamsulosin  (FLOMAX ) 0.4 MG CAPS capsule, Take 1 capsule  (0.4 mg total) by mouth daily., Disp: 14 capsule, Rfl: 0  Observations/Objective: Patient is well-developed, well-nourished in no acute distress.  Resting comfortably  at home.  Head is normocephalic, atraumatic.  No labored breathing.  Speech is clear and coherent with logical content.  Patient is alert and oriented at baseline.    Assessment and Plan: 1. Viral gastroenteritis (Primary) - ondansetron  (ZOFRAN -ODT) 4 MG disintegrating tablet; Take 1 tablet (4 mg total) by mouth every 6 (six) hours as needed for nausea or vomiting.  Dispense: 32 tablet; Refill: 0 - loperamide (IMODIUM A-D) 2 MG tablet; Take 1 tablet (2 mg total) by mouth 3 (three) times daily as needed for diarrhea or loose stools.  Dispense: 30 tablet; Refill: 0   Follow Up Instructions: I discussed the assessment and treatment plan with the patient. The patient was provided an opportunity to ask questions and all were answered. The patient agreed with the plan and demonstrated an understanding of the instructions.  A copy of instructions were sent to the patient via MyChart unless otherwise noted below.     The patient was advised to call back or seek an in-person evaluation if the symptoms worsen or if the condition fails to improve as anticipated.    Daril Warga W Aaiden Depoy, NP

## 2024-03-15 ENCOUNTER — Other Ambulatory Visit: Payer: Self-pay

## 2024-03-16 ENCOUNTER — Telehealth (INDEPENDENT_AMBULATORY_CARE_PROVIDER_SITE_OTHER): Payer: Self-pay | Admitting: Family Medicine

## 2024-03-16 ENCOUNTER — Other Ambulatory Visit: Payer: Self-pay

## 2024-03-16 DIAGNOSIS — Z91199 Patient's noncompliance with other medical treatment and regimen due to unspecified reason: Secondary | ICD-10-CM

## 2024-03-16 NOTE — Patient Instructions (Signed)
 Marland Kitchen  Please review the attached list of medications and notify my office if there are any errors.   . Please bring all of your medications to every appointment so we can make sure that our medication list is the same as yours.

## 2024-03-17 NOTE — Progress Notes (Signed)
 No show

## 2024-03-18 ENCOUNTER — Other Ambulatory Visit: Payer: Self-pay

## 2024-03-18 ENCOUNTER — Other Ambulatory Visit: Payer: Self-pay | Admitting: Family Medicine

## 2024-03-18 DIAGNOSIS — I10 Essential (primary) hypertension: Secondary | ICD-10-CM

## 2024-03-18 MED ORDER — BENAZEPRIL HCL 40 MG PO TABS
40.0000 mg | ORAL_TABLET | Freq: Every day | ORAL | 0 refills | Status: DC
  Filled 2024-03-18 – 2024-03-24 (×2): qty 30, 30d supply, fill #0

## 2024-03-24 ENCOUNTER — Other Ambulatory Visit: Payer: Self-pay

## 2024-04-01 ENCOUNTER — Telehealth: Payer: Self-pay | Admitting: Family Medicine

## 2024-04-01 DIAGNOSIS — I10 Essential (primary) hypertension: Secondary | ICD-10-CM

## 2024-04-01 NOTE — Progress Notes (Signed)
    MyChart Video Visit    Virtual Visit via Video Note   This format is felt to be most appropriate for this patient at this time. Physical exam was limited by quality of the video and audio technology used for the visit.   Patient location: Home Provider location: [facility]  I discussed the limitations of evaluation and management by telemedicine and the availability of in person appointments. The patient expressed understanding and agreed to proceed.  Patient: Eddie Evans   DOB: [birth year]   28 y.o. Male  MRN: [id number] Visit Date: 04/01/2024  Today's healthcare provider: Nancyann Perry, MD    Subjective    HPI  Follow up hypertension. States has been back on benazepril  for a week. Doesn't have BP machine. Was out of medications at his last visit here at the beginning on June. He is able to check his blood pressure at [facility] pharmacy.   BP Readings from Last 3 Encounters:  02/18/24 (!) 165/117  11/21/23 (!) 157/104  07/11/23 (!) 144/92     Medications: Outpatient Medications Prior to Visit  Medication Sig Note   benazepril  (LOTENSIN ) 40 MG tablet Take 1 tablet (40 mg total) by mouth daily.    nicotine  polacrilex (NICORETTE ) 2 MG gum Take 1 each (2 mg total) by mouth as needed for smoking cessation.    No facility-administered medications prior to visit.   Review of Systems  Constitutional:  Negative for appetite change, chills and fever.  Respiratory:  Negative for chest tightness, shortness of breath and wheezing.   Cardiovascular:  Negative for chest pain and palpitations.  Gastrointestinal:  Negative for abdominal pain, nausea and vomiting.       Objective    There were no vitals taken for this visit.   Physical Exam  Awake, alert, oriented x 3. In no apparent distress   Assessment & Plan     1. Primary hypertension (Primary) Is no back on benazepril  40mg  and tolerating well.  He is not checking BP at home. He plans on checking next week at [facility]  pharmacy and will send my a MyChart message with result. Consider adding amlodipine  to regiment. Will need in-person visit in the coming weeks to check on labs as well.      I discussed the assessment and treatment plan with the patient. The patient was provided an opportunity to ask questions and all were answered. The patient agreed with the plan and demonstrated an understanding of the instructions.   The patient was advised to call back or seek an in-person evaluation if the symptoms worsen or if the condition fails to improve as anticipated.  I provided 12 minutes of non-face-to-face time during this encounter.   Nancyann Perry, MD [facility] [phone] (phone) [phone] (fax)  [facility]

## 2024-04-28 ENCOUNTER — Other Ambulatory Visit: Payer: Self-pay | Admitting: Family Medicine

## 2024-04-28 ENCOUNTER — Other Ambulatory Visit: Payer: Self-pay

## 2024-04-28 ENCOUNTER — Telehealth: Payer: Self-pay | Admitting: Family Medicine

## 2024-04-28 DIAGNOSIS — I10 Essential (primary) hypertension: Secondary | ICD-10-CM

## 2024-04-28 MED FILL — Benazepril HCl Tab 40 MG: ORAL | 90 days supply | Qty: 90 | Fill #0 | Status: CN

## 2024-04-28 NOTE — Telephone Encounter (Signed)
 Patient had a virtual visit for blood pressure follow up last month, but he didn't know what his home blood pressure was. He was suppose to check his home blood pressures and send me a mychart message about them. If he is not able to check and report his blood pressures from home then he needs to schedule an in-office visit to check his blood pressure.

## 2024-04-28 NOTE — Telephone Encounter (Signed)
 Tried calling patient/voicemail not set up yet. Will try call later

## 2024-04-29 NOTE — Telephone Encounter (Signed)
 LMTCB to schedule an appointment in person not virtual for BP follow-up.

## 2024-04-30 ENCOUNTER — Ambulatory Visit: Payer: Self-pay

## 2024-04-30 ENCOUNTER — Other Ambulatory Visit: Payer: Self-pay

## 2024-04-30 MED FILL — Benazepril HCl Tab 40 MG: ORAL | 90 days supply | Qty: 90 | Fill #0 | Status: AC

## 2024-04-30 NOTE — Telephone Encounter (Signed)
 Pt states that he has been out of his BP meds for a week and today was the first day he has been able to take his BP. States he is not having any symptoms. NT called pharmacy to verify that meds were received, notified pt that medication was at pharmacy and ready for pick up, instructed pt to go pick medications up and take a dose immediately and then about 2 hrs after taking medications to try to recheck BP and call back with reading. Instructed pt to call back if any symptoms develop. Pt verbalized understanding.

## 2024-04-30 NOTE — Telephone Encounter (Signed)
 FYI Only or Action Required?: FYI only for provider.  Patient was last seen in primary care on 04/01/2024 by Gasper Nancyann BRAVO, MD.  Called Nurse Triage reporting Hypertension.  Symptoms began today.  Interventions attempted: Nothing.  Symptoms are: unchanged.  Triage Disposition: Urgent Home Treatment With Follow-up Call  Patient/caregiver understands and will follow disposition?: Yes     Copied from CRM #[id number]. Topic: Clinical - Red Word Triage >> Apr 30, 2024 12:09 PM Myrick T wrote: Red Word that prompted transfer to Nurse Triage: patient called stated his blood pressure is 185/109. Patient says he is not feeling any symptoms at the moment Reason for Disposition  [1] Systolic BP >= 180 OR Diastolic >= 110 AND [2] missed most recent dose of blood pressure medication  Answer Assessment - Initial Assessment Questions 1. BLOOD PRESSURE: What is your blood pressure? Did you take at least two measurements 5 minutes apart?     185/109 2. ONSET: When did you take your blood pressure?     Today  around 1203 3. HOW: How did you take your blood pressure? (e.g., automatic home BP monitor, visiting nurse)     At [facility] 4. HISTORY: Do you have a history of high blood pressure?     yes 5. MEDICINES: Are you taking any medicines for blood pressure? Have you missed any doses recently?     Yes been out of medication for a week  6. OTHER SYMPTOMS: Do you have any symptoms? (e.g., blurred vision, chest pain, difficulty breathing, headache, weakness)     no  Protocols used: Blood Pressure - High-A-AH

## 2024-05-16 ENCOUNTER — Encounter: Payer: Self-pay | Admitting: Emergency Medicine

## 2024-05-16 ENCOUNTER — Emergency Department: Payer: Self-pay

## 2024-05-16 ENCOUNTER — Emergency Department
Admission: EM | Admit: 2024-05-16 | Discharge: 2024-05-16 | Disposition: A | Discharge status: Home / Self Care | Payer: Self-pay | Attending: Emergency Medicine | Admitting: Emergency Medicine

## 2024-05-16 ENCOUNTER — Other Ambulatory Visit: Payer: Self-pay

## 2024-05-16 DIAGNOSIS — I10 Essential (primary) hypertension: Secondary | ICD-10-CM | POA: Insufficient documentation

## 2024-05-16 DIAGNOSIS — N200 Calculus of kidney: Secondary | ICD-10-CM

## 2024-05-16 DIAGNOSIS — N132 Hydronephrosis with renal and ureteral calculous obstruction: Secondary | ICD-10-CM | POA: Insufficient documentation

## 2024-05-16 LAB — BASIC METABOLIC PANEL WITH GFR
Anion gap: 10 (ref 5–15)
BUN: 13 mg/dL (ref 6–20)
CO2: 24 mmol/L (ref 22–32)
Calcium: 9.5 mg/dL (ref 8.9–10.3)
Chloride: 102 mmol/L (ref 98–111)
Creatinine, Ser: 1 mg/dL (ref 0.61–1.24)
GFR, Estimated: >60 mL/min (ref >60)
Glucose, Bld: 112 mg/dL — ABNORMAL HIGH (ref 70–99)
Potassium: 3.8 mmol/L (ref 3.5–5.1)
Sodium: 136 mmol/L (ref 135–145)

## 2024-05-16 LAB — URINALYSIS, ROUTINE W REFLEX MICROSCOPIC
Bacteria, UA: NONE SEEN
Bilirubin Urine: NEGATIVE
Glucose, UA: NEGATIVE mg/dL
Ketones, ur: NEGATIVE mg/dL
Leukocytes,Ua: NEGATIVE
Nitrite: NEGATIVE
Protein, ur: 100 mg/dL — AB
RBC / HPF: >50 RBC/hpf (ref 0–5)
Specific Gravity, Urine: 1.019 (ref 1.005–1.030)
Squamous Epithelial / HPF: 0 /HPF (ref 0–5)
pH: 5 (ref 5.0–8.0)

## 2024-05-16 LAB — CBC
HCT: 43.5 % (ref 39.0–52.0)
Hemoglobin: 15.1 g/dL (ref 13.0–17.0)
MCH: 30.1 pg (ref 26.0–34.0)
MCHC: 34.7 g/dL (ref 30.0–36.0)
MCV: 86.8 fL (ref 80.0–100.0)
Platelets: 272 K/uL (ref 150–400)
RBC: 5.01 MIL/uL (ref 4.22–5.81)
RDW: 12.9 % (ref 11.5–15.5)
WBC: 15.5 K/uL — ABNORMAL HIGH (ref 4.0–10.5)
nRBC: 0 % (ref 0.0–0.2)

## 2024-05-16 MED ORDER — KETOROLAC TROMETHAMINE 30 MG/ML IJ SOLN
15.0000 mg | Freq: Once | INTRAMUSCULAR | Status: AC
  Administered 2024-05-16: 15 mg via INTRAVENOUS
  Filled 2024-05-16: qty 1

## 2024-05-16 MED ORDER — ONDANSETRON HCL 4 MG/2ML IJ SOLN
4.0000 mg | Freq: Once | INTRAMUSCULAR | Status: AC
  Administered 2024-05-16: 4 mg via INTRAVENOUS
  Filled 2024-05-16: qty 2

## 2024-05-16 MED ORDER — LACTATED RINGERS IV BOLUS
1000.0000 mL | Freq: Once | INTRAVENOUS | Status: AC
  Administered 2024-05-16: 1000 mL via INTRAVENOUS

## 2024-05-16 MED ORDER — OXYCODONE-ACETAMINOPHEN 5-325 MG PO TABS: 1.0000 | ORAL_TABLET | ORAL | 0 refills | Status: DC | PRN

## 2024-05-16 MED ORDER — ONDANSETRON 4 MG PO TBDP: 4.0000 mg | ORAL_TABLET | Freq: Three times a day (TID) | ORAL | 0 refills | Status: DC | PRN

## 2024-05-16 NOTE — ED Provider Notes (Signed)
 [facility] Provider Note    Event Date/Time   First MD Initiated Contact with Patient 05/16/24 2202     (approximate)   History   Chief Complaint Flank Pain   HPI  Eddie Evans is a 28 y.o. male with past medical history of hypertension, anxiety, and stroke who presents to the ED complaining of flank pain.  Patient reports that he had acute onset of left flank pain wrapping around to the left lower quadrant of his abdomen earlier this evening.  He describes associated nausea but denies any vomiting or changes in his bowel movements.  He has not had any fevers, flank pain, dysuria, or hematuria.  He does describe symptoms as similar to prior kidney stone.     Physical Exam   Triage Vital Signs: ED Triage Vitals  Encounter Vitals Group     BP 05/16/24 2031 (!) 152/107     Girls Systolic BP Percentile --      Girls Diastolic BP Percentile --      Boys Systolic BP Percentile --      Boys Diastolic BP Percentile --      Pulse Rate 05/16/24 2031 78     Resp 05/16/24 2031 18     Temp 05/16/24 2031 98.6 F (37 C)     Temp Source 05/16/24 2031 Oral     SpO2 05/16/24 2031 98 %     Weight 05/16/24 2032 165 lb 5.5 oz (75 kg)     Height --      Head Circumference --      Peak Flow --      Pain Score 05/16/24 2031 9     Pain Loc --      Pain Education --      Exclude from Growth Chart --     Most recent vital signs: Vitals:   05/16/24 2031  BP: (!) 152/107  Pulse: 78  Resp: 18  Temp: 98.6 F (37 C)  SpO2: 98%    Constitutional: Alert and oriented. Eyes: Conjunctivae are normal. Head: Atraumatic. Nose: No congestion/rhinnorhea. Mouth/Throat: Mucous membranes are moist.  Cardiovascular: Normal rate, regular rhythm. Grossly normal heart sounds.  2+ radial pulses bilaterally. Respiratory: Normal respiratory effort.  No retractions. Lungs CTAB. Gastrointestinal: Soft and nontender. No distention. Musculoskeletal: No lower extremity  tenderness nor edema.  Neurologic:  Normal speech and language. No gross focal neurologic deficits are appreciated.    ED Results / Procedures / Treatments   Labs (all labs ordered are listed, but only abnormal results are displayed) Labs Reviewed  URINALYSIS, ROUTINE W REFLEX MICROSCOPIC - Abnormal; Notable for the following components:      Result Value   Color, Urine YELLOW (*)    APPearance CLEAR (*)    Hgb urine dipstick LARGE (*)    Protein, ur 100 (*)    All other components within normal limits  CBC - Abnormal; Notable for the following components:   WBC 15.5 (*)    All other components within normal limits  BASIC METABOLIC PANEL WITH GFR - Abnormal; Notable for the following components:   Glucose, Bld 112 (*)    All other components within normal limits    RADIOLOGY CT renal protocol reviewed and interpreted by me with stone in the proximal left ureter.  PROCEDURES:  Critical Care performed: No  Procedures   MEDICATIONS ORDERED IN ED: Medications  ketorolac  (TORADOL ) 30 MG/ML injection 15 mg (15 mg Intravenous Given 05/16/24 2232)  ondansetron  (  ZOFRAN ) injection 4 mg (4 mg Intravenous Given 05/16/24 2232)  lactated ringers  bolus 1,000 mL (1,000 mLs Intravenous New Bag/Given 05/16/24 2236)     IMPRESSION / MDM / ASSESSMENT AND PLAN / ED COURSE  I reviewed the triage vital signs and the nursing notes.                              28 y.o. male with past medical history of hypertension, anxiety, kidney stones who presents to the ED complaining of acute onset left flank pain radiating around to the left lower portion of his abdomen.  Patient's presentation is most consistent with acute presentation with potential threat to life or bodily function.  Differential diagnosis includes, but is not limited to, kidney stone, pyelonephritis, diverticulitis, colitis.  Patient nontoxic-appearing and in no acute distress, vital signs remarkable for hypertension but  otherwise reassuring.  Labs without significant anemia, leukocytosis, electrolyte abnormality, or AKI.  Urinalysis shows blood without evidence of infection.  CT renal protocol confirms proximal left ureteral stone with associated hydronephrosis.  Pain improved on reassessment and patient appropriate for discharge home with outpatient urology follow-up as needed.  He was prescribed a small amount of pain and nausea medication, counseled to return to the ED for new or worsening symptoms.  Patient agrees with plan.      FINAL CLINICAL IMPRESSION(S) / ED DIAGNOSES   Final diagnoses:  Kidney stone     Rx / DC Orders   ED Discharge Orders          Ordered    oxyCODONE -acetaminophen  (PERCOCET) 5-325 MG tablet  Every 4 hours PRN        05/16/24 2317    ondansetron  (ZOFRAN -ODT) 4 MG disintegrating tablet  Every 8 hours PRN        05/16/24 2317             Note:  This document was prepared using Dragon voice recognition software and may include unintentional dictation errors.   Willo Dunnings, MD 05/16/24 2322

## 2024-05-16 NOTE — ED Notes (Signed)
 First nurse note:  BIB [facility] from home for possible kidney stone on the left side. Pt has HX of same.   171/118 - didn't take BP meds this morning but did take this evening late 8/10 pain

## 2024-05-16 NOTE — ED Triage Notes (Signed)
 Patient BIB [facility] c/o left flank pain x today.  Patient reports history of kidney stones.  Patient denies dysuria, and states that he has been able to urinate.

## 2024-05-20 ENCOUNTER — Other Ambulatory Visit: Payer: Self-pay

## 2024-07-22 ENCOUNTER — Other Ambulatory Visit: Payer: Self-pay | Admitting: Family Medicine

## 2024-07-22 DIAGNOSIS — I10 Essential (primary) hypertension: Secondary | ICD-10-CM

## 2024-08-12 ENCOUNTER — Other Ambulatory Visit: Payer: Self-pay

## 2024-08-12 ENCOUNTER — Other Ambulatory Visit: Payer: Self-pay | Admitting: Family Medicine

## 2024-08-12 DIAGNOSIS — I10 Essential (primary) hypertension: Secondary | ICD-10-CM

## 2024-08-14 ENCOUNTER — Other Ambulatory Visit: Payer: Self-pay

## 2024-08-17 ENCOUNTER — Other Ambulatory Visit: Payer: Self-pay

## 2024-08-17 ENCOUNTER — Ambulatory Visit: Payer: Self-pay | Admitting: Family Medicine

## 2024-08-17 ENCOUNTER — Encounter: Payer: Self-pay | Admitting: Family Medicine

## 2024-08-17 DIAGNOSIS — I10 Essential (primary) hypertension: Secondary | ICD-10-CM

## 2024-08-17 MED ORDER — BENAZEPRIL HCL 40 MG PO TABS
40.0000 mg | ORAL_TABLET | Freq: Every day | ORAL | 0 refills | Status: AC
  Filled 2024-08-17 (×2): qty 90, 90d supply, fill #0

## 2024-08-17 NOTE — Progress Notes (Signed)
 Established patient visit   Patient: Eddie Evans   DOB: 1995-09-18   28 y.o. Male  MRN: [id number] Visit Date: 08/17/2024  Today's healthcare provider: Nancyann Perry, MD   Chief Complaint  Patient presents with   Medical Management of Chronic Issues    HTN and history of CVA   Subjective    Discussed the use of AI scribe software for clinical note transcription with the patient, who gave verbal consent to proceed.  History of Present Illness   Eddie Evans is a 28 year old male with hypertension who presents for a blood pressure check.  He has a history of hypertension and is currently not taking his prescribed medication, benazepril , as he has been out of it for about a week. No side effects were experienced from the medication when he was taking it. He has been checking his blood pressure at [employer], but due to being busy with work and car issues, he has not been able to check it regularly. He recalls his blood pressure readings were around 98/100 and 110/120, which he considers to be in the normal range.  He works at [employer], a company currently undergoing bankruptcy and acquisition. He previously worked in heat and air but left due to it affecting him negatively. He finds his current job accommodating to his schedule. He mentions being busy with work and having car issues, which have impacted his ability to manage his health effectively.  He has a history of a stroke. He mentions that he eats a lot and has communicated this to his partner.     BP Readings from Last 3 Encounters:  08/17/24 (!) 166/112  05/16/24 (!) 141/107  02/18/24 (!) 165/117   Lab Results  Component Value Date   NA 136 05/16/2024   K 3.8 05/16/2024   CREATININE 1.00 05/16/2024   GFRNONAA >60 05/16/2024   GLUCOSE 112 (H) 05/16/2024   Lab Results  Component Value Date   CHOL 155 09/06/2022   HDL 48 09/06/2022   LDLCALC 80 09/06/2022   TRIG 134 09/06/2022   CHOLHDL 3.2 09/06/2022      Medications: Outpatient Medications Prior to Visit  Medication Sig   [DISCONTINUED] benazepril  (LOTENSIN ) 40 MG tablet Take 1 tablet (40 mg total) by mouth daily.   [DISCONTINUED] nicotine  polacrilex (NICORETTE ) 2 MG gum Take 1 each (2 mg total) by mouth as needed for smoking cessation.   [DISCONTINUED] ondansetron  (ZOFRAN -ODT) 4 MG disintegrating tablet Take 1 tablet (4 mg total) by mouth every 8 (eight) hours as needed for nausea or vomiting.   [DISCONTINUED] oxyCODONE -acetaminophen  (PERCOCET) 5-325 MG tablet Take 1 tablet by mouth every 4 (four) hours as needed.   No facility-administered medications prior to visit.   Review of Systems  Constitutional:  Negative for appetite change, chills and fever.  Respiratory:  Negative for chest tightness, shortness of breath and wheezing.   Cardiovascular:  Negative for chest pain and palpitations.  Gastrointestinal:  Negative for abdominal pain, nausea and vomiting.       Objective    BP (!) 166/112 (BP Location: Left Arm, Patient Position: Sitting, Cuff Size: Normal)   Pulse 77   Ht 5' 5 (1.651 m)   Wt 172 lb 9.6 oz (78.3 kg)   SpO2 100%   BMI 28.72 kg/m   Physical Exam   General: Appearance:    Well developed, well nourished male in no acute distress  Eyes:    PERRL, conjunctiva/corneas clear, EOM's intact  Lungs:     Clear to auscultation bilaterally, respirations unlabored  Heart:    Normal heart rate. Normal rhythm. No murmurs, rubs, or gallops.    MS:   All extremities are intact.    Neurologic:   Awake, alert, oriented x 3. No apparent focal neurological defect.         Assessment & Plan       Essential hypertension Hypertension management was suboptimal due to a lapse in benazepril . Blood pressure readings were normal. Emphasized importance of maintaining control due to previous stroke. - Prescribed benazepril , sent prescription to [facility]. - Scheduled follow-up in three weeks for blood pressure  assessment and blood work to monitor potassium and sodium. - Instructed fasting before blood work, allowing water and coffee.    Return in about 3 weeks (around 09/07/2024).     Nancyann Perry, MD  [facility] [phone] (phone) [phone] (fax)  [facility]

## 2024-08-17 NOTE — Patient Instructions (Signed)
 SABRA  Please review the attached list of medications and notify my office if there are any errors.   . Please bring all of your medications to every appointment so we can make sure that our medication list is the same as yours.

## 2024-08-31 ENCOUNTER — Ambulatory Visit: Payer: Self-pay | Admitting: Family Medicine

## 2024-09-23 ENCOUNTER — Ambulatory Visit: Payer: Self-pay | Admitting: Family Medicine
